# Patient Record
Sex: Female | Born: 1965 | Race: White | Hispanic: No | Smoking: Former smoker
Health system: Southern US, Community
[De-identification: ages and names within clinical notes are randomized; demographics above are authoritative.]

## PROBLEM LIST (undated history)

## (undated) DIAGNOSIS — G43909 Migraine, unspecified, not intractable, without status migrainosus: Secondary | ICD-10-CM

## (undated) DIAGNOSIS — I10 Essential (primary) hypertension: Secondary | ICD-10-CM

---

## 2003-07-25 ENCOUNTER — Other Ambulatory Visit: Payer: Self-pay

## 2004-02-17 ENCOUNTER — Emergency Department: Payer: Self-pay | Admitting: Emergency Medicine

## 2004-07-27 ENCOUNTER — Emergency Department: Payer: Self-pay | Admitting: Emergency Medicine

## 2004-12-28 ENCOUNTER — Emergency Department: Payer: Self-pay | Admitting: Emergency Medicine

## 2005-03-13 ENCOUNTER — Emergency Department: Payer: Self-pay | Admitting: Unknown Physician Specialty

## 2005-11-09 ENCOUNTER — Emergency Department: Payer: Self-pay | Admitting: Emergency Medicine

## 2007-07-17 ENCOUNTER — Emergency Department: Payer: Self-pay | Admitting: Internal Medicine

## 2007-09-03 ENCOUNTER — Emergency Department: Payer: Self-pay | Admitting: Emergency Medicine

## 2009-01-11 ENCOUNTER — Emergency Department: Payer: Self-pay | Admitting: Emergency Medicine

## 2009-04-27 ENCOUNTER — Emergency Department: Payer: Self-pay | Admitting: Emergency Medicine

## 2010-04-05 ENCOUNTER — Emergency Department: Payer: Self-pay | Admitting: Emergency Medicine

## 2010-11-09 IMAGING — CR DG CHEST 2V
1 series · 2 of 2 positions shown · non-contrast
Comparison: none

REASON FOR EXAM: pain
COMMENTS:

[Series 1: view not recorded · 0.17mm/px · 2 of 2 slices shown]
[im 1/2]
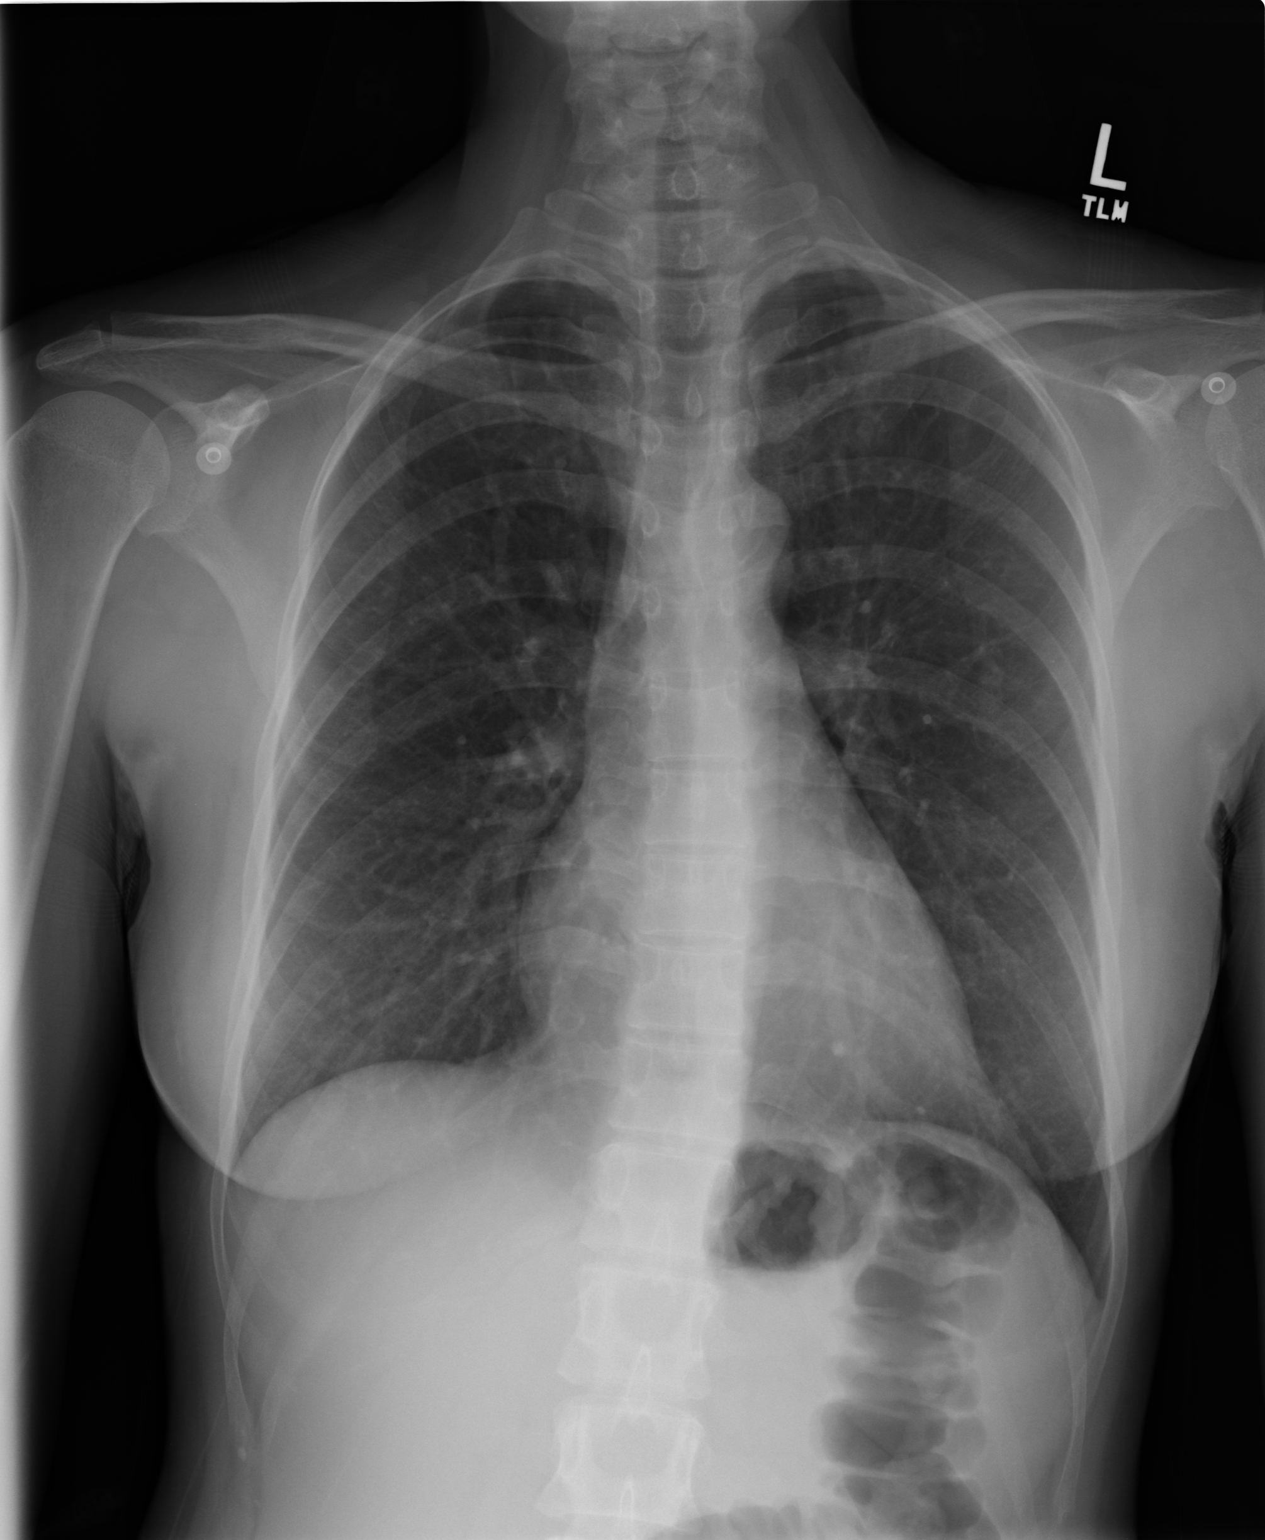
[im 2/2]
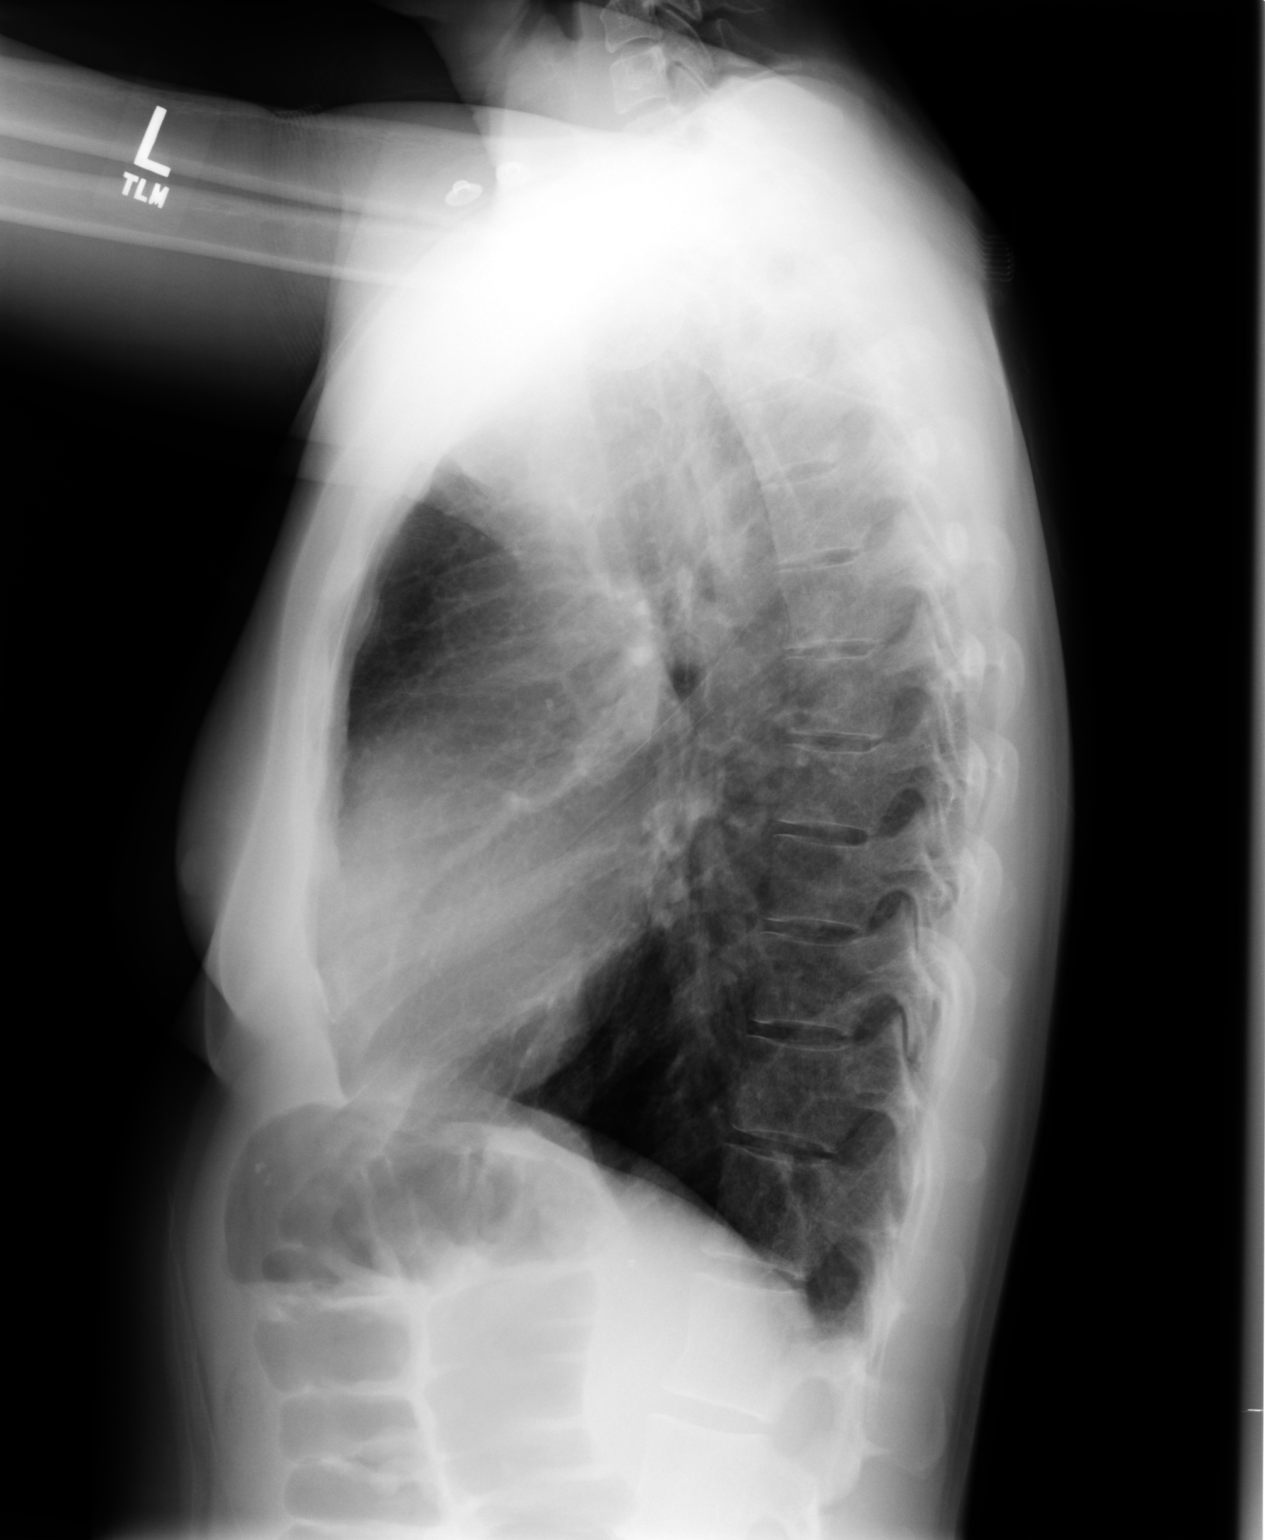

[2 of 2 positions shown; findings below may reference images not displayed]

PROCEDURE:     DXR - DXR CHEST PA (OR AP) AND LATERAL  - January 11, 2009  [DATE]

RESULT:     There is no previous exam for comparison.

There appear to be nodular densities in the hilar regions more numerous on
the left and at the lung bases consistent with calcified granulomas. The
lungs are otherwise clear. The heart and pulmonary vessels are normal. The
bony and mediastinal structures unremarkable.
IMPRESSION: Granulomatous changes. No acute cardiopulmonary disease
evident.

## 2011-10-26 ENCOUNTER — Emergency Department: Payer: Self-pay | Admitting: Emergency Medicine

## 2011-10-28 LAB — BETA STREP CULTURE(ARMC)

## 2012-02-23 ENCOUNTER — Emergency Department: Payer: Self-pay | Admitting: Emergency Medicine

## 2012-02-23 LAB — RAPID INFLUENZA A&B ANTIGENS

## 2012-03-14 ENCOUNTER — Emergency Department: Payer: Self-pay | Admitting: Emergency Medicine

## 2012-03-17 LAB — BETA STREP CULTURE(ARMC)

## 2012-12-12 ENCOUNTER — Emergency Department: Payer: Self-pay | Admitting: Emergency Medicine

## 2013-09-19 ENCOUNTER — Emergency Department: Payer: Self-pay | Admitting: Emergency Medicine

## 2013-10-18 ENCOUNTER — Emergency Department: Payer: Self-pay | Admitting: Emergency Medicine

## 2013-10-22 ENCOUNTER — Emergency Department: Payer: Self-pay | Admitting: Emergency Medicine

## 2013-10-22 LAB — COMPREHENSIVE METABOLIC PANEL
ALT: 17 U/L
Albumin: 4.6 g/dL (ref 3.4–5.0)
Alkaline Phosphatase: 98 U/L
Anion Gap: 12 (ref 7–16)
BUN: 26 mg/dL — ABNORMAL HIGH (ref 7–18)
Bilirubin,Total: 0.7 mg/dL (ref 0.2–1.0)
CALCIUM: 9.5 mg/dL (ref 8.5–10.1)
CHLORIDE: 94 mmol/L — AB (ref 98–107)
CO2: 25 mmol/L (ref 21–32)
Creatinine: 1.62 mg/dL — ABNORMAL HIGH (ref 0.60–1.30)
GFR CALC AF AMER: 42 — AB
GFR CALC NON AF AMER: 37 — AB
GLUCOSE: 78 mg/dL (ref 65–99)
Osmolality: 266 (ref 275–301)
Potassium: 4.1 mmol/L (ref 3.5–5.1)
SGOT(AST): 25 U/L (ref 15–37)
Sodium: 131 mmol/L — ABNORMAL LOW (ref 136–145)
TOTAL PROTEIN: 9 g/dL — AB (ref 6.4–8.2)

## 2013-10-22 LAB — CBC
HCT: 48.5 % — ABNORMAL HIGH (ref 35.0–47.0)
HGB: 16 g/dL (ref 12.0–16.0)
MCH: 28.2 pg (ref 26.0–34.0)
MCHC: 33.1 g/dL (ref 32.0–36.0)
MCV: 85 fL (ref 80–100)
PLATELETS: 290 10*3/uL (ref 150–440)
RBC: 5.69 10*6/uL — ABNORMAL HIGH (ref 3.80–5.20)
RDW: 13.6 % (ref 11.5–14.5)
WBC: 7.8 10*3/uL (ref 3.6–11.0)

## 2013-10-22 LAB — URINALYSIS, COMPLETE
GLUCOSE, UR: NEGATIVE mg/dL (ref 0–75)
LEUKOCYTE ESTERASE: NEGATIVE
NITRITE: NEGATIVE
Ph: 5 (ref 4.5–8.0)
Protein: NEGATIVE
RBC,UR: 1 /HPF (ref 0–5)
Specific Gravity: 1.006 (ref 1.003–1.030)
WBC UR: 1 /HPF (ref 0–5)

## 2013-10-22 LAB — LIPASE, BLOOD: LIPASE: 147 U/L (ref 73–393)

## 2014-05-03 ENCOUNTER — Emergency Department: Payer: Self-pay | Admitting: Internal Medicine

## 2014-05-28 ENCOUNTER — Emergency Department: Payer: Self-pay | Admitting: Student

## 2014-06-29 ENCOUNTER — Other Ambulatory Visit: Payer: Self-pay | Admitting: *Deleted

## 2014-06-29 DIAGNOSIS — Z139 Encounter for screening, unspecified: Secondary | ICD-10-CM

## 2014-07-17 ENCOUNTER — Emergency Department
Admission: EM | Admit: 2014-07-17 | Discharge: 2014-07-17 | Disposition: A | Discharge status: Home / Self Care | Payer: Self-pay | Attending: Emergency Medicine | Admitting: Emergency Medicine

## 2014-07-17 ENCOUNTER — Encounter: Payer: Self-pay | Admitting: Emergency Medicine

## 2014-07-17 DIAGNOSIS — R1030 Lower abdominal pain, unspecified: Secondary | ICD-10-CM

## 2014-07-17 DIAGNOSIS — Z880 Allergy status to penicillin: Secondary | ICD-10-CM | POA: Insufficient documentation

## 2014-07-17 DIAGNOSIS — R197 Diarrhea, unspecified: Secondary | ICD-10-CM

## 2014-07-17 DIAGNOSIS — K529 Noninfective gastroenteritis and colitis, unspecified: Secondary | ICD-10-CM | POA: Insufficient documentation

## 2014-07-17 HISTORY — DX: Essential (primary) hypertension: I10

## 2014-07-17 LAB — COMPREHENSIVE METABOLIC PANEL
ALT: 22 U/L (ref 14–54)
AST: 23 U/L (ref 15–41)
Albumin: 4.5 g/dL (ref 3.5–5.0)
Alkaline Phosphatase: 90 U/L (ref 38–126)
Anion gap: 13 (ref 5–15)
BILIRUBIN TOTAL: 0.7 mg/dL (ref 0.3–1.2)
BUN: 29 mg/dL — ABNORMAL HIGH (ref 6–20)
CO2: 31 mmol/L (ref 22–32)
CREATININE: 1.48 mg/dL — AB (ref 0.44–1.00)
Calcium: 9.4 mg/dL (ref 8.9–10.3)
Chloride: 94 mmol/L — ABNORMAL LOW (ref 101–111)
GFR, EST AFRICAN AMERICAN: 46 mL/min — AB (ref >60)
GFR, EST NON AFRICAN AMERICAN: 40 mL/min — AB (ref >60)
Glucose, Bld: 123 mg/dL — ABNORMAL HIGH (ref 65–99)
POTASSIUM: 2.6 mmol/L — AB (ref 3.5–5.1)
Sodium: 138 mmol/L (ref 135–145)
Total Protein: 8.4 g/dL — ABNORMAL HIGH (ref 6.5–8.1)

## 2014-07-17 LAB — URINALYSIS COMPLETE WITH MICROSCOPIC (ARMC ONLY)
BILIRUBIN URINE: NEGATIVE
Bacteria, UA: NONE SEEN
Glucose, UA: NEGATIVE mg/dL
Ketones, ur: NEGATIVE mg/dL
Nitrite: NEGATIVE
Protein, ur: 100 mg/dL — AB
Specific Gravity, Urine: 1.023 (ref 1.005–1.030)
pH: 5 (ref 5.0–8.0)

## 2014-07-17 LAB — CBC WITH DIFFERENTIAL/PLATELET
Basophils Absolute: 0 10*3/uL (ref 0–0.1)
Basophils Relative: 0 %
Eosinophils Absolute: 0.2 10*3/uL (ref 0–0.7)
Eosinophils Relative: 1 %
HCT: 41.5 % (ref 35.0–47.0)
Hemoglobin: 14.1 g/dL (ref 12.0–16.0)
LYMPHS ABS: 1 10*3/uL (ref 1.0–3.6)
MCH: 28.4 pg (ref 26.0–34.0)
MCHC: 33.9 g/dL (ref 32.0–36.0)
MCV: 83.8 fL (ref 80.0–100.0)
Monocytes Absolute: 1 10*3/uL — ABNORMAL HIGH (ref 0.2–0.9)
Monocytes Relative: 8 %
Neutro Abs: 9.8 10*3/uL — ABNORMAL HIGH (ref 1.4–6.5)
PLATELETS: 311 10*3/uL (ref 150–440)
RBC: 4.96 MIL/uL (ref 3.80–5.20)
RDW: 13.6 % (ref 11.5–14.5)
WBC: 11.9 10*3/uL — ABNORMAL HIGH (ref 3.6–11.0)

## 2014-07-17 MED ORDER — ONDANSETRON HCL 4 MG PO TABS
ORAL_TABLET | ORAL | Status: AC
  Filled 2014-07-17: qty 1

## 2014-07-17 MED ORDER — POTASSIUM CHLORIDE CRYS ER 20 MEQ PO TBCR
EXTENDED_RELEASE_TABLET | ORAL | Status: AC
  Filled 2014-07-17: qty 2

## 2014-07-17 MED ORDER — POTASSIUM CHLORIDE CRYS ER 20 MEQ PO TBCR
40.0000 meq | EXTENDED_RELEASE_TABLET | Freq: Once | ORAL | Status: AC
  Administered 2014-07-17: 40 meq via ORAL

## 2014-07-17 MED ORDER — METRONIDAZOLE 500 MG PO TABS
ORAL_TABLET | ORAL | Status: AC
  Filled 2014-07-17: qty 1

## 2014-07-17 MED ORDER — ONDANSETRON HCL 4 MG PO TABS: 4.0000 mg | ORAL_TABLET | Freq: Every day | ORAL | Status: AC | PRN

## 2014-07-17 MED ORDER — POTASSIUM CHLORIDE 20 MEQ PO PACK: 20.0000 meq | PACK | Freq: Two times a day (BID) | ORAL | Status: DC

## 2014-07-17 MED ORDER — ONDANSETRON HCL 4 MG PO TABS
4.0000 mg | ORAL_TABLET | Freq: Once | ORAL | Status: AC
  Administered 2014-07-17: 4 mg via ORAL

## 2014-07-17 MED ORDER — METRONIDAZOLE 500 MG PO TABS: 500.0000 mg | ORAL_TABLET | Freq: Two times a day (BID) | ORAL | Status: AC

## 2014-07-17 MED ORDER — METRONIDAZOLE 500 MG PO TABS
500.0000 mg | ORAL_TABLET | Freq: Once | ORAL | Status: AC
  Administered 2014-07-17: 500 mg via ORAL

## 2014-07-17 NOTE — ED Notes (Signed)
Pt informed to return if life threatening symptoms occur.   

## 2014-07-17 NOTE — ED Notes (Signed)
Pt reports that she developed BLQ pain a few days ago, today she started having Vomiting

## 2014-07-17 NOTE — Discharge Instructions (Signed)
With your crampy abdominal pain and diarrhea, we agreed that taking an antibiotic and replacing her potassium level is appropriate. Follow-up with your doctor in [place]. Take potassium twice a day as prescribed. He may use Zofran if needed for nausea. Return to the emergency department if you have worsening pain ,if you have fever, or if she had other urgent concerns.   Diarrhea Diarrhea is watery poop (stool). It can make you feel weak, tired, thirsty, or give you a dry mouth (signs of dehydration). Watery poop is a sign of another problem, most often an infection. It often lasts 2-3 days. It can last longer if it is a sign of something serious. Take care of yourself as told by your doctor. HOME CARE   Drink 1 cup (8 ounces) of fluid each time you have watery poop.  Do not drink the following fluids:  Those that contain simple sugars (fructose, glucose, galactose, lactose, sucrose, maltose).  Sports drinks.  Fruit juices.  Whole milk products.  Sodas.  Drinks with caffeine (coffee, tea, soda) or alcohol.  Oral rehydration solution may be used if the doctor says it is okay. You may make your own solution. Follow this recipe:   - teaspoon table salt.   teaspoon baking soda.   teaspoon salt substitute containing potassium chloride.  1 tablespoons sugar.  1 liter (34 ounces) of water.  Avoid the following foods:  High fiber foods, such as raw fruits and vegetables.  Nuts, seeds, and whole grain breads and cereals.   Those that are sweetened with sugar alcohols (xylitol, sorbitol, mannitol).  Try eating the following foods:  Starchy foods, such as rice, toast, pasta, low-sugar cereal, oatmeal, baked potatoes, crackers, and bagels.  Bananas.  Applesauce.  Eat probiotic-rich foods, such as yogurt and milk products that are fermented.  Wash your hands well after each time you have watery poop.  Only take medicine as told by your doctor.  Take a warm bath to help  lessen burning or pain from having watery poop. GET HELP RIGHT AWAY IF:   You cannot drink fluids without throwing up (vomiting).  You keep throwing up.  You have blood in your poop, or your poop looks black and tarry.  You do not pee (urinate) in 6-8 hours, or there is only a small amount of very dark pee.  You have belly (abdominal) pain that gets worse or stays in the same spot (localizes).  You are weak, dizzy, confused, or light-headed.  You have a very bad headache.  Your watery poop gets worse or does not get better.  You have a fever or lasting symptoms for more than 2-3 days.  You have a fever and your symptoms suddenly get worse. MAKE SURE YOU:   Understand these instructions.  Will watch your condition.  Will get help right away if you are not doing well or get worse. Document Released: 08/19/2007 Document Revised: 07/17/2013 Document Reviewed: 11/08/2011 [facility] Patient Information 2015 [place], [place]. This information is not intended to replace advice given to you by your health care provider. Make sure you discuss any questions you have with your health care provider.

## 2014-07-17 NOTE — ED Provider Notes (Signed)
[facility] Emergency Department Provider Note    ____________________________________________  Time seen: 12:45 PM  I have reviewed the triage vital signs and the nursing notes.   HISTORY  Chief Complaint Abdominal Pain   HPI Alexandria Waller is a 49 y.o. female who presents with intermittent abdominal pain and cramping over the past week and a half. She has had intermittent diarrhea as well as occasional vomiting. Her discomfort is in the lower abdomen, primarily central. She has called out from work for a few days due to these symptoms. Today she was at work and had to go to the bathroom 5 times and shows to seek care. She also vomited once this morning. Her pain is primarily crampy and moderate. She looks comfortable during the interview. She has no surgical history. She did recently see her primary care physician in [place] for a routine visit. She had her tetanus updated and has plans for [facility] tomorrow.  Past Medical History  Diagnosis Date  . Hypertension    hypokalemia  There are no active problems to display for this patient.   History reviewed. No pertinent past surgical history.  Current Outpatient Rx  Name  Route  Sig  Dispense  Refill  . metroNIDAZOLE (FLAGYL) 500 MG tablet   Oral   Take 1 tablet (500 mg total) by mouth 2 (two) times daily.   15 tablet   0   . ondansetron (ZOFRAN) 4 MG tablet   Oral   Take 1 tablet (4 mg total) by mouth daily as needed for nausea or vomiting.   10 tablet   1   . potassium chloride (KLOR-CON) 20 MEQ packet   Oral   Take 20 mEq by mouth 2 (two) times daily.   8 tablet   0     Allergies Amoxicillin; Aspirin; and Penicillins  History reviewed. No pertinent family history.  Social History History  Substance Use Topics  . Smoking status: Never Smoker   . Smokeless tobacco: Not on file  . Alcohol Use: No    Review  of Systems  Constitutional: Negative for fever. Eyes: Negative for visual changes. ENT: Patient reports a history of sinus problems. Cardiovascular: Negative for chest pain. Respiratory: Negative for shortness of breath. Gastrointestinal: Patient presents with abdominal pain, vomiting and diarrhea. See history of present illness for details. Genitourinary: Negative for dysuria. Musculoskeletal: Negative for back pain. Skin: Negative for rash. Neurological: Negative for headaches, focal weakness or numbness.    10-point ROS otherwise negative.  ____________________________________________   PHYSICAL EXAM:  VITAL SIGNS: ED Triage Vitals  Enc Vitals Group     BP --      Pulse Rate 07/17/14 1112 99     Resp 07/17/14 1112 20     Temp 07/17/14 1112 97.9 F (36.6 C)     Temp Source 07/17/14 1112 Oral     SpO2 07/17/14 1112 99 %     Weight 07/17/14 1112 125 lb (56.7 kg)     Height 07/17/14 1112 5\' 3"  (1.6 m)     Head Cir --      Peak Flow --      Pain Score 07/17/14 1115 8     Pain Loc --      Pain Edu? --      Excl. in GC? --      Constitutional: Alert and oriented. Well appearing and in no distress. ENT  Head: Normocephalic and atraumatic.   Nose: No congestion/rhinnorhea.   Mouth/Throat: Mucous membranes are moist.  Hematological/Lymphatic/Immunilogical: No cervical lymphadenopathy. Cardiovascular: Normal rate, regular rhythm. Normal and symmetric distal pulses are present in all extremities. No murmurs, rubs, or gallops. Respiratory: Normal respiratory effort without tachypnea nor retractions. Breath sounds are clear and equal bilaterally. No wheezes/rales/rhonchi. Gastrointestinal: Soft abdomen which is mostly benign. She has no peritoneal signs. She does have a mild amount of tenderness in the lower central abdomen. There is no tenderness over McBurney's point.  Musculoskeletal: Nontender with normal range of motion in all extremities. No joint  effusions.  No lower extremity tenderness nor edema. Neurologic:  Normal speech and language. No gross focal neurologic deficits are appreciated. Speech is normal. No gait instability. Skin:  Skin is warm, dry and intact. No rash noted. Psychiatric: The patient is pleasant, calm, and personable. Mood and affect are normal. Speech and behavior are normal. Patient exhibits appropriate insight and judgment.  ____________________________________________    LABS (pertinent positives/negatives)  Notable lab findings include a potassium of 2.6. She has a BUN and of 29 creatinine of 1.48. Her urinalysis shows 6-30 white blood cells with only a trace of leukocytes.  ____________________________________________   INITIAL IMPRESSION / ASSESSMENT AND PLAN / ED COURSE  Pertinent labs & imaging results that were available during my care of the patient were reviewed by me and considered in my medical decision making (see chart for details).  The patient appears comfortable in no acute distress. I have discussed a CT scan of the abdomen versus the alternative of treatment with antibiotics and antinausea medicine. The patient agrees that a CT scan is not indicated at this time area given the patient's history of "sinus infections" and the use of antibiotics, she is at some risk for Clostridium difficile. We have attempted to obtain a sample of stool to test for C. Difficile, but she is not sure if she'll be able to have a bowel movement at this time.. We will treat with metronidazole and with Zofran.  We will begin her on oral potassium replacement and prescribed potassium over the next 4 days to replenish her potassium level. The patient is able to follow up with her primary physician in [place], but she also has an appointment tomorrow for a Pap smear as well as a mammogram. The patient agrees with this plan and will return to the emergency department if her pain worsens, if she has fever, or if she has any  other urgent concerns.  ____________________________________________   FINAL CLINICAL IMPRESSION(S) / ED DIAGNOSES  Final diagnoses:  Colitis  Lower abdominal pain  Diarrhea   hypokalemia   Darien Ramus, MD 07/17/14 1339

## 2014-07-18 ENCOUNTER — Ambulatory Visit: Payer: Self-pay

## 2014-07-25 ENCOUNTER — Other Ambulatory Visit: Payer: Self-pay

## 2014-07-25 ENCOUNTER — Encounter: Payer: Self-pay | Admitting: Emergency Medicine

## 2014-07-25 ENCOUNTER — Emergency Department
Admission: EM | Admit: 2014-07-25 | Discharge: 2014-07-25 | Disposition: A | Discharge status: Home / Self Care | Payer: Self-pay | Attending: Emergency Medicine | Admitting: Emergency Medicine

## 2014-07-25 DIAGNOSIS — Z79899 Other long term (current) drug therapy: Secondary | ICD-10-CM | POA: Insufficient documentation

## 2014-07-25 DIAGNOSIS — Z880 Allergy status to penicillin: Secondary | ICD-10-CM | POA: Insufficient documentation

## 2014-07-25 DIAGNOSIS — K529 Noninfective gastroenteritis and colitis, unspecified: Secondary | ICD-10-CM | POA: Insufficient documentation

## 2014-07-25 DIAGNOSIS — R42 Dizziness and giddiness: Secondary | ICD-10-CM | POA: Insufficient documentation

## 2014-07-25 DIAGNOSIS — I10 Essential (primary) hypertension: Secondary | ICD-10-CM | POA: Insufficient documentation

## 2014-07-25 DIAGNOSIS — Z3202 Encounter for pregnancy test, result negative: Secondary | ICD-10-CM | POA: Insufficient documentation

## 2014-07-25 LAB — COMPREHENSIVE METABOLIC PANEL
ALK PHOS: 65 U/L (ref 38–126)
ALT: 14 U/L (ref 14–54)
AST: 21 U/L (ref 15–41)
Albumin: 4.2 g/dL (ref 3.5–5.0)
Anion gap: 9 (ref 5–15)
BUN: 27 mg/dL — ABNORMAL HIGH (ref 6–20)
CALCIUM: 9.3 mg/dL (ref 8.9–10.3)
CHLORIDE: 101 mmol/L (ref 101–111)
CO2: 27 mmol/L (ref 22–32)
Creatinine, Ser: 1.09 mg/dL — ABNORMAL HIGH (ref 0.44–1.00)
GFR calc Af Amer: >60 mL/min (ref >60)
GFR calc non Af Amer: 58 mL/min — ABNORMAL LOW (ref >60)
Glucose, Bld: 84 mg/dL (ref 65–99)
POTASSIUM: 3.7 mmol/L (ref 3.5–5.1)
Sodium: 137 mmol/L (ref 135–145)
Total Bilirubin: 0.5 mg/dL (ref 0.3–1.2)
Total Protein: 7 g/dL (ref 6.5–8.1)

## 2014-07-25 LAB — CBC WITH DIFFERENTIAL/PLATELET
BASOS PCT: 0 %
Basophils Absolute: 0 10*3/uL (ref 0–0.1)
Eosinophils Absolute: 0.1 10*3/uL (ref 0–0.7)
Eosinophils Relative: 0 %
HCT: 39.1 % (ref 35.0–47.0)
Hemoglobin: 12.7 g/dL (ref 12.0–16.0)
LYMPHS PCT: 4 %
Lymphs Abs: 0.8 10*3/uL — ABNORMAL LOW (ref 1.0–3.6)
MCH: 27.9 pg (ref 26.0–34.0)
MCHC: 32.6 g/dL (ref 32.0–36.0)
MCV: 85.7 fL (ref 80.0–100.0)
MONOS PCT: 5 %
Monocytes Absolute: 1 10*3/uL — ABNORMAL HIGH (ref 0.2–0.9)
NEUTROS PCT: 91 %
Neutro Abs: 17.8 10*3/uL — ABNORMAL HIGH (ref 1.4–6.5)
Platelets: 264 10*3/uL (ref 150–440)
RBC: 4.56 MIL/uL (ref 3.80–5.20)
RDW: 14.4 % (ref 11.5–14.5)
WBC: 19.8 10*3/uL — AB (ref 3.6–11.0)

## 2014-07-25 LAB — URINALYSIS COMPLETE WITH MICROSCOPIC (ARMC ONLY)
Bacteria, UA: NONE SEEN
Bilirubin Urine: NEGATIVE
GLUCOSE, UA: NEGATIVE mg/dL
Hgb urine dipstick: NEGATIVE
Ketones, ur: NEGATIVE mg/dL
Leukocytes, UA: NEGATIVE
Nitrite: NEGATIVE
Protein, ur: NEGATIVE mg/dL
SPECIFIC GRAVITY, URINE: 1.015 (ref 1.005–1.030)
pH: 6 (ref 5.0–8.0)

## 2014-07-25 LAB — TROPONIN I: Troponin I: <0.03 ng/mL (ref <0.031)

## 2014-07-25 LAB — PREGNANCY, URINE: PREG TEST UR: NEGATIVE

## 2014-07-25 LAB — LIPASE, BLOOD: Lipase: 48 U/L (ref 22–51)

## 2014-07-25 MED ORDER — METRONIDAZOLE 500 MG PO TABS: 500.0000 mg | ORAL_TABLET | Freq: Three times a day (TID) | ORAL | Status: AC

## 2014-07-25 MED ORDER — ONDANSETRON 4 MG PO TBDP
ORAL_TABLET | ORAL | Status: AC
  Filled 2014-07-25: qty 1

## 2014-07-25 MED ORDER — ONDANSETRON HCL 4 MG PO TABS: 4.0000 mg | ORAL_TABLET | Freq: Every day | ORAL | Status: AC | PRN

## 2014-07-25 MED ORDER — ONDANSETRON 4 MG PO TBDP
4.0000 mg | ORAL_TABLET | Freq: Once | ORAL | Status: AC
  Administered 2014-07-25: 4 mg via ORAL

## 2014-07-25 NOTE — Discharge Instructions (Signed)

## 2014-07-25 NOTE — ED Notes (Signed)
Patient asked to put gown on and patient refused. She advised she only wanted to know her urine and blood results.

## 2014-07-25 NOTE — ED Notes (Signed)
Called lab about results.  Lab looking into them

## 2014-07-25 NOTE — ED Provider Notes (Signed)
[facility] Emergency Department Provider Note    Time seen: 1430  I have reviewed the triage vital signs and the nursing notes.   HISTORY  Chief Complaint Vomiting and Dizziness    HPI Alexandria Waller is a 49 y.o. female who presents to ER for nausea dizziness and diarrhea today. Patient was recently seen in the ER and diagnosed with colitis. Nausea and diarrhea has subsided for now it is been mild today. Diarrhea has not been completely watery, and it had resolved since she has finished antibiotics when she was here last time. Describes some intermittent cramping abdominal pain. Was sent here for work for same    Past Medical History  Diagnosis Date  . Hypertension     There are no active problems to display for this patient.   History reviewed. No pertinent past surgical history.  Current Outpatient Rx  Name  Route  Sig  Dispense  Refill  . carvedilol (COREG) 25 MG tablet   Oral   Take 25 mg by mouth 2 (two) times daily with a meal.         . cetirizine (ZYRTEC) 10 MG tablet   Oral   Take 10 mg by mouth daily.         . fluticasone (FLONASE) 50 MCG/ACT nasal spray   Each Nare   Place 2 sprays into both nostrils daily.         . hydrochlorothiazide (HYDRODIURIL) 25 MG tablet   Oral   Take 25 mg by mouth daily.         Marland Kitchen. ibuprofen (ADVIL,MOTRIN) 200 MG tablet   Oral   Take 200 mg by mouth every 6 (six) hours as needed for headache.         . ondansetron (ZOFRAN) 4 MG tablet   Oral   Take 1 tablet (4 mg total) by mouth daily as needed for nausea or vomiting.   10 tablet   1   . potassium chloride (KLOR-CON) 20 MEQ packet   Oral   Take 20 mEq by mouth 2 (two) times daily.   8 tablet   0   . PRESCRIPTION MEDICATION      Migraine PRN medication- unable to verify name of medication           Allergies Amoxicillin; Aspirin; and Penicillins  No family history on file.  Social History History  Substance Use  Topics  . Smoking status: Never Smoker   . Smokeless tobacco: Not on file  . Alcohol Use: No    Review of Systems Constitutional: Negative for fever. Eyes: Negative for visual changes. ENT: Negative for sore throat. Cardiovascular: Negative for chest pain. Respiratory: Negative for shortness of breath. Gastrointestinal: Mild abdominal pain, nausea and diarrhea. Genitourinary: Negative for dysuria. Musculoskeletal: Negative for back pain. Skin: Negative for rash. Neurological: Negative for headaches, positive for dizziness  10-point ROS otherwise negative.  ____________________________________________   PHYSICAL EXAM:  VITAL SIGNS: ED Triage Vitals  Enc Vitals Group     BP 07/25/14 1129 124/79 mmHg     Pulse Rate 07/25/14 1129 88     Resp 07/25/14 1129 14     Temp 07/25/14 1129 97.6 F (36.4 C)     Temp Source 07/25/14 1129 Oral     SpO2 07/25/14 1129 97 %     Weight 07/25/14 1129 125 lb (56.7 kg)     Height 07/25/14 1129 5\' 2"  (1.575 m)     Head Cir --  Peak Flow --      Pain Score 07/25/14 1130 5     Pain Loc --      Pain Edu? --      Excl. in GC? --     Constitutional: Alert and oriented. Well appearing and in no distress. Eyes: Conjunctivae are normal. PERRL. Normal extraocular movements. ENT   Head: Normocephalic and atraumatic.   Nose: No congestion/rhinnorhea.   Mouth/Throat: Mucous membranes are moist.   Neck: No stridor. Hematological/Lymphatic/Immunilogical: No cervical lymphadenopathy. Cardiovascular: Normal rate, regular rhythm. Normal and symmetric distal pulses are present in all extremities. No murmurs, rubs, or gallops. Respiratory: Normal respiratory effort without tachypnea nor retractions. Breath sounds are clear and equal bilaterally. No wheezes/rales/rhonchi. Gastrointestinal: Soft and nontender. No distention. No abdominal bruits. There is no CVA tenderness. Musculoskeletal: Nontender with normal range of motion in all  extremities. No joint effusions.  No lower extremity tenderness nor edema. Neurologic:  Normal speech and language. No gross focal neurologic deficits are appreciated. Speech is normal. No gait instability. Skin:  Skin is warm, dry and intact. No rash noted. Psychiatric: Mood and affect are normal. Speech and behavior are normal. Patient exhibits appropriate insight and judgment.  ____________________________________________    LABS (pertinent positives/negatives)  Labs Reviewed  CBC WITH DIFFERENTIAL/PLATELET - Abnormal; Notable for the following:    WBC 19.8 (*)    Neutro Abs 17.8 (*)    Lymphs Abs 0.8 (*)    Monocytes Absolute 1.0 (*)    All other components within normal limits  URINALYSIS COMPLETEWITH MICROSCOPIC ([facility])  - Abnormal; Notable for the following:    Color, Urine YELLOW (*)    APPearance CLEAR (*)    Squamous Epithelial / LPF 0-5 (*)    All other components within normal limits  PREGNANCY, URINE  LIPASE, BLOOD  TROPONIN I  COMPREHENSIVE METABOLIC PANEL     ____________________________________________    RADIOLOGY  None  ____________________________________________    ED COURSE  Pertinent labs & imaging results that were available during my care of the patient were reviewed by me and considered in my medical decision making (see chart for details).  Check basic labs, by mouth Zofran and reassess.  FINAL ASSESSMENT AND PLAN  Diarrhea and leukocytosis  Plan: Patient is a very benign abdominal exam. Despite the fact that her white count has gone up, she looks very well. 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 During this time she'll need follow-up with GI for reevaluation. Patient is also unable to provide an additional stool sample here to send for culture.    Emily FilbertWilliams, Jonathan E, MD   Emily FilbertJonathan E Williams, MD 07/25/14 [phone] [id number]

## 2014-07-25 NOTE — ED Notes (Signed)
Patient c/o vomiting, chills and dizziness. Symptoms started today. States she had similar symptoms last week and was diagnosed with colitis.

## 2014-08-29 ENCOUNTER — Encounter: Payer: Self-pay | Admitting: *Deleted

## 2014-08-29 ENCOUNTER — Ambulatory Visit: Payer: Self-pay | Attending: Oncology | Admitting: *Deleted

## 2014-08-29 ENCOUNTER — Ambulatory Visit
Admission: RE | Admit: 2014-08-29 | Discharge: 2014-08-29 | Disposition: A | Discharge status: Home / Self Care | Payer: Self-pay | Source: Ambulatory Visit | Attending: Oncology | Admitting: Oncology

## 2014-08-29 VITALS — BP 160/113 | HR 71 | Temp 98.0°F | Resp 20 | Ht 63.39 in | Wt 122.9 lb

## 2014-08-29 DIAGNOSIS — Z0000 Encounter for general adult medical examination without abnormal findings: Secondary | ICD-10-CM

## 2014-08-29 NOTE — Progress Notes (Signed)
Subjective:     Patient ID: Alexandria Waller, female   DOB: 1965/03/24, 49 y.o.   MRN: [id number]  HPI   Review of Systems     Objective:   Physical Exam  Pulmonary/Chest: Right breast exhibits no inverted nipple, no mass, no nipple discharge, no skin change and no tenderness. Left breast exhibits no inverted nipple, no mass, no nipple discharge, no skin change and no tenderness.  Abdominal: She exhibits no mass. There is no splenomegaly or hepatomegaly.  Genitourinary: Rectal exam shows no mass and no tenderness. Uterus is not deviated, not enlarged, not fixed and not tender. Cervix exhibits no motion tenderness and no friability. Right adnexum displays no mass, no tenderness and no fullness. Left adnexum displays no mass, no tenderness and no fullness.         Assessment:     49 year old White female presents to [facility] for clinical breast exam, pap smear and mammogram.  Clinical breast exam unremarkable.  Specimen collected for pap smear without difficulty.  Blood pressure elevated at 160/113.  Patient states her blood pressure is like this when she goes to the doctor but will come down later.  She is to go to the ED or  recheck her blood pressure at [facility] or [facility], and if remains higher than 140/90 she is to follow-up with her primary care provider.  Hand out on hypertention given to patient.  Patient has been screened for eligibility.  She does not have any insurance, Medicare or Medicaid.  She also meets financial eligibility.  Hand-out given on the Affordable Care Act.     Plan:     Screening mammogram ordered.  Patient states she will recheck her blood pressure and if remains elevated she will see her MD today.

## 2014-08-31 LAB — PAP LB AND HPV HIGH-RISK
HPV, HIGH-RISK: NEGATIVE
PAP SMEAR COMMENT: 0

## 2014-09-10 ENCOUNTER — Other Ambulatory Visit: Payer: Self-pay | Admitting: *Deleted

## 2014-09-10 DIAGNOSIS — N63 Unspecified lump in unspecified breast: Secondary | ICD-10-CM

## 2014-09-10 DIAGNOSIS — R928 Other abnormal and inconclusive findings on diagnostic imaging of breast: Secondary | ICD-10-CM

## 2014-09-19 ENCOUNTER — Ambulatory Visit
Admission: RE | Admit: 2014-09-19 | Discharge: 2014-09-19 | Disposition: A | Discharge status: Home / Self Care | Payer: Self-pay | Source: Ambulatory Visit | Attending: Oncology | Admitting: Oncology

## 2014-09-19 DIAGNOSIS — N63 Unspecified lump in unspecified breast: Secondary | ICD-10-CM

## 2014-09-19 DIAGNOSIS — R928 Other abnormal and inconclusive findings on diagnostic imaging of breast: Secondary | ICD-10-CM

## 2014-09-20 ENCOUNTER — Other Ambulatory Visit: Payer: Self-pay | Admitting: *Deleted

## 2014-09-20 DIAGNOSIS — N63 Unspecified lump in unspecified breast: Secondary | ICD-10-CM

## 2014-09-20 NOTE — Progress Notes (Signed)
Birads 4 mammogram result.  Order placed for ultrasound guided left breast biopsy for left breast mass per protocol.

## 2014-09-21 ENCOUNTER — Other Ambulatory Visit: Payer: Self-pay | Admitting: *Deleted

## 2014-09-21 ENCOUNTER — Ambulatory Visit
Admission: RE | Admit: 2014-09-21 | Discharge: 2014-09-21 | Disposition: A | Discharge status: Home / Self Care | Payer: Self-pay | Source: Ambulatory Visit | Attending: Oncology | Admitting: Oncology

## 2014-09-21 DIAGNOSIS — N63 Unspecified lump in unspecified breast: Secondary | ICD-10-CM

## 2014-09-24 LAB — SURGICAL PATHOLOGY

## 2014-09-27 ENCOUNTER — Telehealth: Payer: Self-pay | Admitting: *Deleted

## 2014-09-27 NOTE — Telephone Encounter (Signed)
Tried to call patient with her benign breast biopsy results.  Left message for her to return my call.

## 2014-10-10 ENCOUNTER — Encounter: Payer: Self-pay | Admitting: *Deleted

## 2014-10-10 NOTE — Progress Notes (Signed)
Letter mailed to inform patient of her normal pap results and biopsy results.  She is to follow up in one year with her annual screening mammogram.  Next pap due in 5 years.

## 2015-03-11 ENCOUNTER — Emergency Department
Admission: EM | Admit: 2015-03-11 | Discharge: 2015-03-11 | Disposition: A | Discharge status: Home / Self Care | Payer: Self-pay | Attending: Emergency Medicine | Admitting: Emergency Medicine

## 2015-03-11 DIAGNOSIS — F17200 Nicotine dependence, unspecified, uncomplicated: Secondary | ICD-10-CM | POA: Insufficient documentation

## 2015-03-11 DIAGNOSIS — Z79899 Other long term (current) drug therapy: Secondary | ICD-10-CM | POA: Insufficient documentation

## 2015-03-11 DIAGNOSIS — J0100 Acute maxillary sinusitis, unspecified: Secondary | ICD-10-CM | POA: Insufficient documentation

## 2015-03-11 DIAGNOSIS — I10 Essential (primary) hypertension: Secondary | ICD-10-CM | POA: Insufficient documentation

## 2015-03-11 DIAGNOSIS — Z7951 Long term (current) use of inhaled steroids: Secondary | ICD-10-CM | POA: Insufficient documentation

## 2015-03-11 DIAGNOSIS — Z880 Allergy status to penicillin: Secondary | ICD-10-CM | POA: Insufficient documentation

## 2015-03-11 MED ORDER — HYDROCOD POLST-CPM POLST ER 10-8 MG/5ML PO SUER
5.0000 mL | Freq: Once | ORAL | Status: AC
  Administered 2015-03-11: 5 mL via ORAL
  Filled 2015-03-11: qty 5

## 2015-03-11 MED ORDER — PROMETHAZINE-DM 6.25-15 MG/5ML PO SYRP: 5.0000 mL | ORAL_SOLUTION | Freq: Four times a day (QID) | ORAL | Status: DC | PRN

## 2015-03-11 MED ORDER — SULFAMETHOXAZOLE-TRIMETHOPRIM 800-160 MG PO TABS: 1.0000 | ORAL_TABLET | Freq: Two times a day (BID) | ORAL | Status: DC

## 2015-03-11 NOTE — Discharge Instructions (Signed)
Sinusitis, Adult  Sinusitis is redness, soreness, and puffiness (inflammation) of the air pockets in the bones of your face (sinuses). The redness, soreness, and puffiness can cause air and mucus to get trapped in your sinuses. This can allow germs to grow and cause an infection.   HOME CARE    Drink enough fluids to keep your pee (urine) clear or pale yellow.   Use a humidifier in your home.   Run a hot shower to create steam in the bathroom. Sit in the bathroom with the door closed. Breathe in the steam 3-4 times a day.   Put a warm, moist washcloth on your face 3-4 times a day, or as told by your doctor.   Use salt water sprays (saline sprays) to wet the thick fluid in your nose. This can help the sinuses drain.   Only take medicine as told by your doctor.  GET HELP RIGHT AWAY IF:    Your pain gets worse.   You have very bad headaches.   You are sick to your stomach (nauseous).   You throw up (vomit).   You are very sleepy (drowsy) all the time.   Your face is puffy (swollen).   Your vision changes.   You have a stiff neck.   You have trouble breathing.  MAKE SURE YOU:    Understand these instructions.   Will watch your condition.   Will get help right away if you are not doing well or get worse.     This information is not intended to replace advice given to you by your health care provider. Make sure you discuss any questions you have with your health care provider.     Document Released: 08/19/2007 Document Revised: 03/23/2014 Document Reviewed: 10/06/2011  [employer] Interactive Patient Education 2016 [employer]

## 2015-03-11 NOTE — ED Provider Notes (Signed)
[facility]  ____________________________________________  Time seen: Approximately 9:26 AM  I have reviewed the triage vital signs and the nursing notes.   HISTORY  Chief Complaint Cough    HPI Alexandria Waller is a 49 y.o. female patient states sinus and chest congestion for more than a week. Patient states she's taking over-the-counter sinus medicines any relief.Patient states the cough secondary to postnasal drainage. She stated there is a pressure and frontal headache. Patient also developed a sore throat 2 days ago. Patient denies nausea vomiting diarrhea. Patient rates her pain discomfort as a 4/10.   Past Medical History  Diagnosis Date  . Hypertension     There are no active problems to display for this patient.   History reviewed. No pertinent past surgical history.  Current Outpatient Rx  Name  Route  Sig  Dispense  Refill  . carvedilol (COREG) 25 MG tablet   Oral   Take 25 mg by mouth 2 (two) times daily with a meal.         . cetirizine (ZYRTEC) 10 MG tablet   Oral   Take 10 mg by mouth daily.         . fluticasone (FLONASE) 50 MCG/ACT nasal spray   Each Nare   Place 2 sprays into both nostrils daily.         . hydrochlorothiazide (HYDRODIURIL) 25 MG tablet   Oral   Take 25 mg by mouth daily.         Marland Kitchen ibuprofen (ADVIL,MOTRIN) 200 MG tablet   Oral   Take 200 mg by mouth every 6 (six) hours as needed for headache.         . ondansetron (ZOFRAN) 4 MG tablet   Oral   Take 1 tablet (4 mg total) by mouth daily as needed for nausea or vomiting.   10 tablet   1   . ondansetron (ZOFRAN) 4 MG tablet   Oral   Take 1 tablet (4 mg total) by mouth daily as needed for nausea or vomiting.   30 tablet   1   . potassium chloride (KLOR-CON) 20 MEQ packet   Oral   Take 20 mEq by mouth 2 (two) times daily.   8 tablet   0   . PRESCRIPTION MEDICATION      Migraine PRN medication- unable  to verify name of medication         . promethazine-dextromethorphan (PROMETHAZINE-DM) 6.25-15 MG/5ML syrup   Oral   Take 5 mLs by mouth 4 (four) times daily as needed for cough.   118 mL   0   . sulfamethoxazole-trimethoprim (BACTRIM DS,SEPTRA DS) 800-160 MG tablet   Oral   Take 1 tablet by mouth 2 (two) times daily.   20 tablet   0     Allergies Amoxicillin; Aspirin; and Penicillins  Family History  Problem Relation Age of Onset  . Breast cancer Sister 42    Social History Social History  Substance Use Topics  . Smoking status: Current Every Day Smoker  . Smokeless tobacco: None  . Alcohol Use: No    Review of Systems Constitutional: No fever/chills Eyes: No visual changes. ENT: Sore throat. Sinus congestion.  Cardiovascular: Denies chest pain. Respiratory: Denies shortness of breath. Nonproductive cough Gastrointestinal: No abdominal pain.  No nausea, no vomiting.  No diarrhea.  No constipation. Genitourinary: Negative for dysuria. Musculoskeletal: Negative for back pain. Skin: Negative for rash. Neurological: Positive for headaches, but denies  focal weakness or numbness. Endocrine:Hypertension Hematological/Lymphatic: Allergic/Immunilogical: Penicillin  10-point ROS otherwise negative.  ____________________________________________   PHYSICAL EXAM:  VITAL SIGNS: ED Triage Vitals  Enc Vitals Group     BP 03/11/15 0855 144/98 mmHg     Pulse Rate 03/11/15 0855 88     Resp 03/11/15 0855 18     Temp 03/11/15 0855 97.7 F (36.5 C)     Temp Source 03/11/15 0855 Oral     SpO2 03/11/15 0855 95 %     Weight 03/11/15 0855 123 lb (55.792 kg)     Height 03/11/15 0855 5\' 3"  (1.6 m)     Head Cir --      Peak Flow --      Pain Score 03/11/15 0919 4     Pain Loc --      Pain Edu? --      Excl. in GC? --     Constitutional: Alert and oriented. Well appearing and in no acute distress. Eyes: Conjunctivae are normal. PERRL. EOMI. Head: Atraumatic. Nose:  Bilateral maxillary guarding. Edematous nasal turbinates. Mouth/Throat: Mucous membranes are moist.  Oropharynx erythematous. Nonexudative tonsil but obvious postnasal drainage. Neck: No stridor.  No cervical spine tenderness to palpation. Hematological/Lymphatic/Immunilogical: No cervical lymphadenopathy. Cardiovascular: Normal rate, regular rhythm. Grossly normal heart sounds.  Good peripheral circulation. Respiratory: Normal respiratory effort.  No retractions. Lungs CTAB. Nonproductive cough Gastrointestinal: Soft and nontender. No distention. No abdominal bruits. No CVA tenderness. Musculoskeletal: No lower extremity tenderness nor edema.  No joint effusions. Neurologic:  Normal speech and language. No gross focal neurologic deficits are appreciated. No gait instability. Skin:  Skin is warm, dry and intact. No rash noted. Psychiatric: Mood and affect are normal. Speech and behavior are normal.  ____________________________________________   LABS (all labs ordered are listed, but only abnormal results are displayed)  Labs Reviewed - No data to display ____________________________________________  EKG   ____________________________________________  RADIOLOGY   ____________________________________________   PROCEDURES  Procedure(s) performed: None  Critical Care performed: No  ____________________________________________   INITIAL IMPRESSION / ASSESSMENT AND PLAN / ED COURSE  Pertinent labs & imaging results that were available during my care of the patient were reviewed by me and considered in my medical decision making (see chart for details).  Sinusitis. She given discharge Instructions. Patient given a prescription for Bactrim and promethazine DM. ____________________________________________   FINAL CLINICAL IMPRESSION(S) / ED DIAGNOSES  Final diagnoses:  Acute maxillary sinusitis, recurrence not specified      Joni ReiningRonald K Evan Osburn, PA-C 03/11/15  0935  Jene Everyobert Kinner, MD 03/11/15 [phone]

## 2015-03-11 NOTE — ED Notes (Signed)
Pt c/o sinus and chest congestion since last Monday with a HA, states she has been taking allergy meds without any relief.

## 2015-06-17 ENCOUNTER — Emergency Department
Admission: EM | Admit: 2015-06-17 | Discharge: 2015-06-17 | Disposition: A | Discharge status: Home / Self Care | Payer: Self-pay | Attending: Emergency Medicine | Admitting: Emergency Medicine

## 2015-06-17 ENCOUNTER — Encounter: Payer: Self-pay | Admitting: Emergency Medicine

## 2015-06-17 DIAGNOSIS — N289 Disorder of kidney and ureter, unspecified: Secondary | ICD-10-CM | POA: Insufficient documentation

## 2015-06-17 DIAGNOSIS — R358 Other polyuria: Secondary | ICD-10-CM | POA: Insufficient documentation

## 2015-06-17 DIAGNOSIS — E876 Hypokalemia: Secondary | ICD-10-CM | POA: Insufficient documentation

## 2015-06-17 DIAGNOSIS — E871 Hypo-osmolality and hyponatremia: Secondary | ICD-10-CM | POA: Insufficient documentation

## 2015-06-17 DIAGNOSIS — I10 Essential (primary) hypertension: Secondary | ICD-10-CM | POA: Insufficient documentation

## 2015-06-17 DIAGNOSIS — Z880 Allergy status to penicillin: Secondary | ICD-10-CM | POA: Insufficient documentation

## 2015-06-17 DIAGNOSIS — Z888 Allergy status to other drugs, medicaments and biological substances status: Secondary | ICD-10-CM | POA: Insufficient documentation

## 2015-06-17 DIAGNOSIS — R3589 Other polyuria: Secondary | ICD-10-CM

## 2015-06-17 DIAGNOSIS — Z79899 Other long term (current) drug therapy: Secondary | ICD-10-CM | POA: Insufficient documentation

## 2015-06-17 DIAGNOSIS — F17200 Nicotine dependence, unspecified, uncomplicated: Secondary | ICD-10-CM | POA: Insufficient documentation

## 2015-06-17 LAB — CBC
HCT: 40 % (ref 35.0–47.0)
HEMOGLOBIN: 13.8 g/dL (ref 12.0–16.0)
MCH: 27.7 pg (ref 26.0–34.0)
MCHC: 34.6 g/dL (ref 32.0–36.0)
MCV: 80.1 fL (ref 80.0–100.0)
Platelets: 130 10*3/uL — ABNORMAL LOW (ref 150–440)
RBC: 4.99 MIL/uL (ref 3.80–5.20)
RDW: 14.5 % (ref 11.5–14.5)
WBC: 6.4 10*3/uL (ref 3.6–11.0)

## 2015-06-17 LAB — URINALYSIS COMPLETE WITH MICROSCOPIC (ARMC ONLY)
Bilirubin Urine: NEGATIVE
Glucose, UA: NEGATIVE mg/dL
Ketones, ur: NEGATIVE mg/dL
Nitrite: NEGATIVE
PROTEIN: 30 mg/dL — AB
SPECIFIC GRAVITY, URINE: 1.017 (ref 1.005–1.030)
TRANS EPITHEL UA: 2
pH: 5 (ref 5.0–8.0)

## 2015-06-17 LAB — HEPATIC FUNCTION PANEL
ALT: 29 U/L (ref 14–54)
AST: 41 U/L (ref 15–41)
Albumin: 4.4 g/dL (ref 3.5–5.0)
Alkaline Phosphatase: 109 U/L (ref 38–126)
BILIRUBIN TOTAL: 0.7 mg/dL (ref 0.3–1.2)
Total Protein: 8.2 g/dL — ABNORMAL HIGH (ref 6.5–8.1)

## 2015-06-17 LAB — BASIC METABOLIC PANEL
ANION GAP: 16 — AB (ref 5–15)
Anion gap: 5 (ref 5–15)
BUN: 26 mg/dL — ABNORMAL HIGH (ref 6–20)
BUN: 19 mg/dL (ref 6–20)
CALCIUM: 9.6 mg/dL (ref 8.9–10.3)
CALCIUM: 7.6 mg/dL — AB (ref 8.9–10.3)
CO2: 24 mmol/L (ref 22–32)
CO2: 24 mmol/L (ref 22–32)
CREATININE: 1.11 mg/dL — AB (ref 0.44–1.00)
Chloride: 89 mmol/L — ABNORMAL LOW (ref 101–111)
Chloride: 106 mmol/L (ref 101–111)
Creatinine, Ser: 1.38 mg/dL — ABNORMAL HIGH (ref 0.44–1.00)
GFR calc non Af Amer: 56 mL/min — ABNORMAL LOW (ref >60)
GFR, EST AFRICAN AMERICAN: 50 mL/min — AB (ref >60)
GFR, EST NON AFRICAN AMERICAN: 43 mL/min — AB (ref >60)
Glucose, Bld: 120 mg/dL — ABNORMAL HIGH (ref 65–99)
Glucose, Bld: 88 mg/dL (ref 65–99)
Potassium: 2.1 mmol/L — CL (ref 3.5–5.1)
Potassium: 2.9 mmol/L — CL (ref 3.5–5.1)
SODIUM: 129 mmol/L — AB (ref 135–145)
SODIUM: 135 mmol/L (ref 135–145)

## 2015-06-17 LAB — TSH: TSH: 2.918 u[IU]/mL (ref 0.350–4.500)

## 2015-06-17 MED ORDER — SODIUM CHLORIDE 0.9 % IV BOLUS (SEPSIS)
1000.0000 mL | Freq: Once | INTRAVENOUS | Status: AC
  Administered 2015-06-17: 1000 mL via INTRAVENOUS

## 2015-06-17 MED ORDER — POTASSIUM CHLORIDE 10 MEQ/100ML IV SOLN
10.0000 meq | INTRAVENOUS | Status: AC
  Administered 2015-06-17 (×4): 10 meq via INTRAVENOUS
  Filled 2015-06-17 (×4): qty 100

## 2015-06-17 MED ORDER — ONDANSETRON HCL 4 MG/2ML IJ SOLN
4.0000 mg | Freq: Once | INTRAMUSCULAR | Status: AC
  Administered 2015-06-17: 4 mg via INTRAVENOUS
  Filled 2015-06-17: qty 2

## 2015-06-17 MED ORDER — POTASSIUM CHLORIDE CRYS ER 20 MEQ PO TBCR
40.0000 meq | EXTENDED_RELEASE_TABLET | Freq: Once | ORAL | Status: AC
  Administered 2015-06-17: 40 meq via ORAL
  Filled 2015-06-17: qty 2

## 2015-06-17 MED ORDER — MECLIZINE HCL 25 MG PO TABS
25.0000 mg | ORAL_TABLET | Freq: Once | ORAL | Status: AC
  Administered 2015-06-17: 25 mg via ORAL
  Filled 2015-06-17: qty 1

## 2015-06-17 NOTE — ED Notes (Signed)
Orthostatic Vital Signs:   Lying:       HR: 86, BP: 125/86 Sitting:     HR: 96, BP 130/91 Standing: HR: 92, BP: 130/88

## 2015-06-17 NOTE — ED Provider Notes (Signed)
[facility] Emergency Department Provider Note  ____________________________________________  Time seen: Approximately 2:40 PM  I have reviewed the triage vital signs and the nursing notes.   HISTORY  Chief Complaint Dizziness    HPI Alexandria Waller is a 50 y.o. female with a history of HTN on HCTZ without any recent changes in her medications presenting with lightheadedness, nausea without vomiting. Patient reports that for 4 days she has had intermittent lightheadedness which usually occurs with standing. It resolves if she sits down. Initially, she felt that it was similar to previous vertigo she had had but it is not room spinning dizziness that is a lightheadedness and presyncope. She denies any chest pain, palpitations, shortness of breath, cough or cold symptoms, fever or chills. She does have associated nausea and decreased appetite, without vomiting. No diarrhea. No known sick contacts. No headache, numbness tingling or weakness, visual changes or changes in her speech, no changes in her mental status.   Past Medical History  Diagnosis Date  . Hypertension     There are no active problems to display for this patient.   History reviewed. No pertinent past surgical history.  Current Outpatient Rx  Name  Route  Sig  Dispense  Refill  . carvedilol (COREG) 25 MG tablet   Oral   Take 25 mg by mouth 2 (two) times daily with a meal.         . cetirizine (ZYRTEC) 10 MG tablet   Oral   Take 10 mg by mouth daily.         . fluticasone (FLONASE) 50 MCG/ACT nasal spray   Each Nare   Place 2 sprays into both nostrils daily.         . ondansetron (ZOFRAN) 4 MG tablet   Oral   Take 1 tablet (4 mg total) by mouth daily as needed for nausea or vomiting.   10 tablet   1   . ondansetron (ZOFRAN) 4 MG tablet   Oral   Take 1 tablet (4 mg total) by mouth daily as needed for nausea or vomiting.   30 tablet   1   . potassium chloride (KLOR-CON)  20 MEQ packet   Oral   Take 20 mEq by mouth 2 (two) times daily.   8 tablet   0   . PRESCRIPTION MEDICATION      Migraine PRN medication- unable to verify name of medication         . promethazine-dextromethorphan (PROMETHAZINE-DM) 6.25-15 MG/5ML syrup   Oral   Take 5 mLs by mouth 4 (four) times daily as needed for cough.   118 mL   0   . sulfamethoxazole-trimethoprim (BACTRIM DS,SEPTRA DS) 800-160 MG tablet   Oral   Take 1 tablet by mouth 2 (two) times daily.   20 tablet   0     Allergies Amoxicillin; Aspirin; and Penicillins  Family History  Problem Relation Age of Onset  . Breast cancer Sister [id number]    Social History Social History  Substance Use Topics  . Smoking status: Current Every Day Smoker  . Smokeless tobacco: None  . Alcohol Use: No    Review of Systems Constitutional: No fever/chills. Positive postural lightheadedness. Negative syncope. Eyes: No visual changes. Chronic stigmatism causing medial gaze of her left eye. No blurred or double vision. ENT: No sore throat. No congestion or rhinorrhea. Cardiovascular: Denies chest pain. Denies palpitations. Respiratory: Denies shortness of breath.  No cough. Gastrointestinal: No abdominal pain. Positive decreased  appetite. Positive nausea, no vomiting.  No diarrhea.  No constipation. Genitourinary: Negative for dysuria. Musculoskeletal: Negative for back pain. Skin: Negative for rash. Neurological: Negative for headaches. No focal numbness, tingling or weakness.   10-point ROS otherwise negative.  ____________________________________________   PHYSICAL EXAM:  VITAL SIGNS: ED Triage Vitals  Enc Vitals Group     BP 06/17/15 1335 121/84 mmHg     Pulse Rate 06/17/15 1335 94     Resp 06/17/15 1335 16     Temp 06/17/15 1335 98.3 F (36.8 C)     Temp Source 06/17/15 1335 Oral     SpO2 06/17/15 1335 100 %     Weight 06/17/15 1335 125 lb (56.7 kg)     Height 06/17/15 1335 5' (1.524 m)     Head Cir --       Peak Flow --      Pain Score --      Pain Loc --      Pain Edu? --      Excl. in GC? --     Constitutional: Alert and oriented. Well appearing and in no acute distress. Answers questions appropriately. Eyes: Conjunctivae are normal.  EOMI. No scleral icterus. Medial gaze at rest of the left eye. Head: Atraumatic. Nose: No congestion/rhinnorhea. Mouth/Throat: Mucous membranes are dry.  Neck: No stridor.  Supple.  No JVD. No meningismus. Cardiovascular: Normal rate, regular rhythm. No murmurs, rubs or gallops.  Respiratory: Normal respiratory effort.  No accessory muscle use or retractions. Lungs CTAB.  No wheezes, rales or ronchi. Gastrointestinal: Soft, nontender and nondistended.  No guarding or rebound.  No peritoneal signs. Musculoskeletal: No LE edema. No ttp in the calves or palpable cords.  Negative Homan's sign. Neurologic:  A&Ox3.  Speech is clear.  Face and smile are symmetric.  EOMI.  Moves all extremities well. Skin:  Skin is warm, dry and intact. No rash noted. Psychiatric: Mood and affect are normal. Speech and behavior are normal.  Normal judgement  ____________________________________________   LABS (all labs ordered are listed, but only abnormal results are displayed)  Labs Reviewed  BASIC METABOLIC PANEL - Abnormal; Notable for the following:    Sodium 129 (*)    Potassium 2.1 (*)    Chloride 89 (*)    Glucose, Bld 120 (*)    BUN 26 (*)    Creatinine, Ser 1.38 (*)    GFR calc non Af Amer 43 (*)    GFR calc Af Amer 50 (*)    Anion gap 16 (*)    All other components within normal limits  CBC - Abnormal; Notable for the following:    Platelets 130 (*)    All other components within normal limits  URINALYSIS COMPLETEWITH MICROSCOPIC ([facility] ONLY) - Abnormal; Notable for the following:    Color, Urine YELLOW (*)    APPearance HAZY (*)    Hgb urine dipstick 1+ (*)    Protein, ur 30 (*)    Leukocytes, UA 1+ (*)    Bacteria, UA RARE (*)    Squamous  Epithelial / LPF 6-30 (*)    All other components within normal limits  HEPATIC FUNCTION PANEL - Abnormal; Notable for the following:    Total Protein 8.2 (*)    Bilirubin, Direct <0.1 (*)    All other components within normal limits  BASIC METABOLIC PANEL - Abnormal; Notable for the following:    Potassium 2.9 (*)    Creatinine, Ser 1.11 (*)    Calcium  7.6 (*)    GFR calc non Af Amer 56 (*)    All other components within normal limits  TSH   ____________________________________________  EKG  ED ECG REPORT I, Rockne Menghini, the attending physician, personally viewed and interpreted this ECG.   Date: 06/17/2015  EKG Time: 1338  Rate: 93  Rhythm: normal sinus rhythm  Axis: Normal intervals.  Intervals:none  ST&T Change: No ST changes.  ____________________________________________  RADIOLOGY  No results found.  ____________________________________________   PROCEDURES  Procedure(s) performed: None  Critical Care performed: No ____________________________________________   INITIAL IMPRESSION / ASSESSMENT AND PLAN / ED COURSE  Pertinent labs & imaging results that were available during my care of the patient were reviewed by me and considered in my medical decision making (see chart for details).  50 y.o. F w/ hx of HTN on HCTZ presenting w/ postural presyncopal episodes w/ nausea but no other associated sx's.  The patient's initial vital signs are normal and her exam is reassuring. There are no abnormal focal neurologic deficits nor cardiopulmonary abnormalities.  Her labs however grossly abnormal. She has a potassium of 2.1 and a sodium of 129, as well as a mild bump in her creatinine to 1.38 and an elevated BUN. It is possible that these changes are related to her HCTZ and decreased by mouth intake, so I will plan to supplement her and give her IV fluids. We also get orthostatic vital signs to see if her postural lightheadedness is due to  orthostasis.  ----------------------------------------- 4:51 PM on 06/17/2015 -----------------------------------------  The patient is not orthostatic. However, she has continued to feel lightheaded even when she is in the stretcher. She is receiving her fluids, and her potassium supplementation, then we'll recheck her basic metabolic panel.  ----------------------------------------- 7:46 PM on 06/17/2015 -----------------------------------------  The patient states that she is feeling much better after her IV fluids. At this point in her workup, the most likely etiology of her symptoms is over diuresis from her HCTZ. She was not orthostatic on exam, but her postural lightheadedness associated with hyponatremia and hypokalemia, as well as mild renal insufficiency is most consistent with dehydration. I'll plan to have her hold that medication until she is able to talk to her primary care physician tomorrow.  ----------------------------------------- 8:58 PM on 06/17/2015 -----------------------------------------  The patient's renal insufficiency and her sodium have significantly improved after fluid. Her potassium is also trending upwards. I will have her stop her diuretic, and follow up with her primary care physician tomorrow. Overall, the patient is clinically feeling much improved. ____________________________________________  FINAL CLINICAL IMPRESSION(S) / ED DIAGNOSES  Final diagnoses:  Hypokalemia  Hyponatremia  Acute renal insufficiency  Diuresis excessive      NEW MEDICATIONS STARTED DURING THIS VISIT:  New Prescriptions   No medications on file     Rockne Menghini, MD 06/17/15 2058

## 2015-06-17 NOTE — ED Notes (Signed)
Called pharmacy for meds

## 2015-06-17 NOTE — Discharge Instructions (Signed)
Please hold your HCTZ medication until you have been instructed to restart it by your regular doctor.  Do not take any NSAID medications, such as Ibuprofen, as this may worsen your kidney function.  Make an appointment with your primary care doctor TOMORROW to have your bloodwork rechecked, and your symptoms re-evaluated.  Return to the emergency department for severe pain, fainting or near fainting, chest pain, or any other symptoms concerning to you.

## 2015-06-17 NOTE — ED Notes (Signed)
Pt reports nausea, dizziness and chills x3 days.

## 2016-03-02 ENCOUNTER — Emergency Department
Admission: EM | Admit: 2016-03-02 | Discharge: 2016-03-02 | Disposition: A | Discharge status: Home / Self Care | Payer: Self-pay | Attending: Emergency Medicine | Admitting: Emergency Medicine

## 2016-03-02 DIAGNOSIS — J069 Acute upper respiratory infection, unspecified: Secondary | ICD-10-CM | POA: Insufficient documentation

## 2016-03-02 DIAGNOSIS — I10 Essential (primary) hypertension: Secondary | ICD-10-CM | POA: Insufficient documentation

## 2016-03-02 DIAGNOSIS — Z87891 Personal history of nicotine dependence: Secondary | ICD-10-CM | POA: Insufficient documentation

## 2016-03-02 DIAGNOSIS — J019 Acute sinusitis, unspecified: Secondary | ICD-10-CM | POA: Insufficient documentation

## 2016-03-02 DIAGNOSIS — Z79899 Other long term (current) drug therapy: Secondary | ICD-10-CM | POA: Insufficient documentation

## 2016-03-02 LAB — POCT RAPID STREP A: Streptococcus, Group A Screen (Direct): NEGATIVE

## 2016-03-02 MED ORDER — AZITHROMYCIN 250 MG PO TABS: 250.0000 mg | ORAL_TABLET | Freq: Every day | ORAL | 0 refills | Status: DC

## 2016-03-02 MED ORDER — AZITHROMYCIN 500 MG PO TABS
500.0000 mg | ORAL_TABLET | Freq: Once | ORAL | Status: AC
  Administered 2016-03-02: 500 mg via ORAL
  Filled 2016-03-02: qty 1

## 2016-03-02 NOTE — ED Triage Notes (Signed)
Pt presents to ED with c/o cough, sore throat and headache since Saturday. Pt reports (+) N/V Saturday night and Sunday morning, but none since. Pt states cough is non-productive, (+) chest congestion; fever at home tmax of 100.3. Pt also reports left-sided HA that is similar to her h/x of migraines; denies visual changes, denies unilateral weakness of numbness.

## 2016-03-02 NOTE — ED Provider Notes (Signed)
[facility] Emergency Department Provider Note  Time seen: 7:42 AM  I have reviewed the triage vital signs and the nursing notes.   HISTORY  Chief Complaint Fever; Cough; and Sore Throat    HPI Alexandria Waller is a 50 y.o. female with a past medical history of hypertension who presents to the emergency department with cough, congestion and fever. According to the patient for the past 1.5 weeks she has been coughing with nasal congestion, sinus congestion and subjective fevers. States she was feeling better however 2 days ago she once again began coughing with congestion. States she was up coughing last night so she came to the emergency department for evaluation. States the fever has gone away. Denies any trouble breathing. Denies any chest pain. Denies nausea or vomiting. Denies abdominal pain.  Past Medical History:  Diagnosis Date  . Hypertension     There are no active problems to display for this patient.   History reviewed. No pertinent surgical history.  Prior to Admission medications   Medication Sig Start Date End Date Taking? Authorizing Provider  carvedilol (COREG) 25 MG tablet Take 25 mg by mouth 2 (two) times daily with a meal.    Historical Provider, MD  cetirizine (ZYRTEC) 10 MG tablet Take 10 mg by mouth daily.    Historical Provider, MD  fluticasone (FLONASE) 50 MCG/ACT nasal spray Place 2 sprays into both nostrils daily.    Historical Provider, MD  potassium chloride (KLOR-CON) 20 MEQ packet Take 20 mEq by mouth 2 (two) times daily. 07/17/14   Darien Ramusavid W Kaminski, MD  PRESCRIPTION MEDICATION Migraine PRN medication- unable to verify name of medication    Historical Provider, MD  promethazine-dextromethorphan (PROMETHAZINE-DM) 6.25-15 MG/5ML syrup Take 5 mLs by mouth 4 (four) times daily as needed for cough. 03/11/15   Joni Reiningonald K Smith, PA-C  sulfamethoxazole-trimethoprim (BACTRIM DS,SEPTRA DS) 800-160 MG tablet Take 1 tablet by mouth 2 (two)  times daily. 03/11/15   Joni Reiningonald K Smith, PA-C    Allergies  Allergen Reactions  . Amoxicillin Nausea And Vomiting  . Aspirin Nausea And Vomiting  . Penicillins Nausea And Vomiting    Family History  Problem Relation Age of Onset  . Breast cancer Sister [id number]    Social History Social History  Substance Use Topics  . Smoking status: Former Games developermoker  . Smokeless tobacco: Never Used  . Alcohol use No    Review of Systems Constitutional: Subjective fevers, now resolved. Positive for congestion. Cardiovascular: Negative for chest pain. Respiratory: Negative for shortness of breath. Positive for cough. No sputum production. Gastrointestinal: Negative for abdominal pain, vomiting and diarrhea. Neurological: Mild intermittent headache. No focal weakness or numbness. 10-point ROS otherwise negative.  ____________________________________________   PHYSICAL EXAM:  VITAL SIGNS: ED Triage Vitals  Enc Vitals Group     BP 03/02/16 0404 (!) 171/115     Pulse Rate 03/02/16 0404 92     Resp 03/02/16 0404 16     Temp 03/02/16 0404 98.7 F (37.1 C)     Temp Source 03/02/16 0404 Oral     SpO2 03/02/16 0404 97 %     Weight 03/02/16 0404 126 lb (57.2 kg)     Height 03/02/16 0404 5\' 3"  (1.6 m)     Head Circumference --      Peak Flow --      Pain Score 03/02/16 0405 8     Pain Loc --      Pain Edu? --  Excl. in GC? --     Constitutional: Alert and oriented. Well appearing and in no distress. Eyes: Normal exam ENT   Head: Normocephalic and atraumatic.   Mouth/Throat: Mucous membranes are moist.Moderate nasal congestion. Cardiovascular: Normal rate, regular rhythm. No murmur Respiratory: Normal respiratory effort without tachypnea nor retractions. Breath sounds are clear  Gastrointestinal: Soft and nontender. No distention.  Musculoskeletal: Nontender with normal range of motion in all extremities. Neurologic:  Normal speech and language. No gross focal neurologic deficits   Skin:  Skin is warm, dry and intact.  Psychiatric: Mood and affect are normal.   ____________________________________________   INITIAL IMPRESSION / ASSESSMENT AND PLAN / ED COURSE  Pertinent labs & imaging results that were available during my care of the patient were reviewed by me and considered in my medical decision making (see chart for details).  Strep test is negative. Patient presents with subjective fever, cough and congestion for the past 1.5 weeks. Clear lung sounds bilaterally. Normal vitals. Overall the patient appears well, suspected degree of bronchitis as well as sinusitis. As his symptoms have been ongoing greater than 7 days I will cover with antibiotics. Patient will follow up with primary care doctor in 2-3 days for recheck if she remains symptomatic. Provided my normal URI return precautions.  ____________________________________________   FINAL CLINICAL IMPRESSION(S) / ED DIAGNOSES  Upper respiratory infection Sinusitis    Minna AntisKevin Zoey Bidwell, MD 03/02/16 [id number]

## 2016-03-02 NOTE — ED Notes (Signed)
Pt placed in ER RM 16 after waiting in the lobby for four hours with no blood work completed. NAD. Pt ambulatory to tx room with NAD.

## 2016-03-04 LAB — CULTURE, GROUP A STREP (THRC)

## 2016-04-11 ENCOUNTER — Emergency Department: Payer: Self-pay

## 2016-04-11 ENCOUNTER — Emergency Department
Admission: EM | Admit: 2016-04-11 | Discharge: 2016-04-11 | Disposition: A | Discharge status: Home / Self Care | Payer: Self-pay | Attending: Emergency Medicine | Admitting: Emergency Medicine

## 2016-04-11 ENCOUNTER — Encounter: Payer: Self-pay | Admitting: *Deleted

## 2016-04-11 DIAGNOSIS — M779 Enthesopathy, unspecified: Secondary | ICD-10-CM | POA: Insufficient documentation

## 2016-04-11 DIAGNOSIS — Y999 Unspecified external cause status: Secondary | ICD-10-CM | POA: Insufficient documentation

## 2016-04-11 DIAGNOSIS — Y929 Unspecified place or not applicable: Secondary | ICD-10-CM | POA: Insufficient documentation

## 2016-04-11 DIAGNOSIS — Z87891 Personal history of nicotine dependence: Secondary | ICD-10-CM | POA: Insufficient documentation

## 2016-04-11 DIAGNOSIS — I10 Essential (primary) hypertension: Secondary | ICD-10-CM | POA: Insufficient documentation

## 2016-04-11 DIAGNOSIS — M778 Other enthesopathies, not elsewhere classified: Secondary | ICD-10-CM

## 2016-04-11 DIAGNOSIS — W2201XA Walked into wall, initial encounter: Secondary | ICD-10-CM | POA: Insufficient documentation

## 2016-04-11 DIAGNOSIS — S63601A Unspecified sprain of right thumb, initial encounter: Secondary | ICD-10-CM | POA: Insufficient documentation

## 2016-04-11 DIAGNOSIS — Z79899 Other long term (current) drug therapy: Secondary | ICD-10-CM | POA: Insufficient documentation

## 2016-04-11 DIAGNOSIS — Y939 Activity, unspecified: Secondary | ICD-10-CM | POA: Insufficient documentation

## 2016-04-11 DIAGNOSIS — M79644 Pain in right finger(s): Secondary | ICD-10-CM

## 2016-04-11 MED ORDER — NAPROXEN 500 MG PO TABS: 500.0000 mg | ORAL_TABLET | Freq: Two times a day (BID) | ORAL | 0 refills | Status: DC

## 2016-04-11 NOTE — ED Provider Notes (Signed)
[facility] Emergency Department Provider Note  ____________________________________________   First MD Initiated Contact with Patient 04/11/16 1154     (approximate)  I have reviewed the triage vital signs and the nursing notes.   HISTORY  Chief Complaint Hand Injury    HPI Alexandria Waller is a 51 y.o. female patient complain right hand pain secondary to hitting head against a wall 3 days ago. Patient states she's having decreased range of motion with flexion of the right thumb. Patient state there is a "popping" sensation when she flexes the thumb. Patient denies thumb pain Loc any position. Patient is right-hand dominant. Patient rates pain as a 6/10. Patient described a pain as "achy".   Past Medical History:  Diagnosis Date  . Hypertension     There are no active problems to display for this patient.   History reviewed. No pertinent surgical history.  Prior to Admission medications   Medication Sig Start Date End Date Taking? Authorizing Provider  azithromycin (ZITHROMAX) 250 MG tablet Take 1 tablet (250 mg total) by mouth daily. 03/02/16   Minna Antis, MD  carvedilol (COREG) 25 MG tablet Take 25 mg by mouth 2 (two) times daily with a meal.    Historical Provider, MD  cetirizine (ZYRTEC) 10 MG tablet Take 10 mg by mouth daily.    Historical Provider, MD  fluticasone (FLONASE) 50 MCG/ACT nasal spray Place 2 sprays into both nostrils daily.    Historical Provider, MD  naproxen (NAPROSYN) 500 MG tablet Take 1 tablet (500 mg total) by mouth 2 (two) times daily with a meal. 04/11/16   Joni Reining, PA-C  potassium chloride (KLOR-CON) 20 MEQ packet Take 20 mEq by mouth 2 (two) times daily. 07/17/14   Darien Ramus, MD  PRESCRIPTION MEDICATION Migraine PRN medication- unable to verify name of medication    Historical Provider, MD  promethazine-dextromethorphan (PROMETHAZINE-DM) 6.25-15 MG/5ML syrup Take 5 mLs by mouth 4 (four) times daily as  needed for cough. 03/11/15   Joni Reining, PA-C  sulfamethoxazole-trimethoprim (BACTRIM DS,SEPTRA DS) 800-160 MG tablet Take 1 tablet by mouth 2 (two) times daily. 03/11/15   Joni Reining, PA-C    Allergies Amoxicillin; Aspirin; and Penicillins  Family History  Problem Relation Age of Onset  . Breast cancer Sister 43    Social History Social History  Substance Use Topics  . Smoking status: Former Games developer  . Smokeless tobacco: Never Used  . Alcohol use No    Review of Systems Constitutional: No fever/chills Eyes: No visual changes. ENT: No sore throat. Cardiovascular: Denies chest pain. Respiratory: Denies shortness of breath. Gastrointestinal: No abdominal pain.  No nausea, no vomiting.  No diarrhea.  No constipation. Genitourinary: Negative for dysuria. Musculoskeletal: Right thumb pain  Skin: Negative for rash. Neurological: Negative for headaches, focal weakness or numbness. Endocrine:Hypertension Hematological/Lymphatic: Allergic/Immunilogical: See medication list.  ____________________________________________   PHYSICAL EXAM:  VITAL SIGNS: ED Triage Vitals  Enc Vitals Group     BP 04/11/16 1134 (!) 138/96     Pulse Rate 04/11/16 1134 77     Resp 04/11/16 1134 16     Temp 04/11/16 1134 98.2 F (36.8 C)     Temp Source 04/11/16 1134 Oral     SpO2 04/11/16 1134 99 %     Weight 04/11/16 1134 125 lb (56.7 kg)     Height 04/11/16 1134 5\' 3"  (1.6 m)     Head Circumference --      Peak Flow --  Pain Score 04/11/16 1135 6     Pain Loc --      Pain Edu? --      Excl. in GC? --     Constitutional: Alert and oriented. Well appearing and in no acute distress. Eyes: Conjunctivae are normal. PERRL. EOMI. Head: Atraumatic. Nose: No congestion/rhinnorhea. Mouth/Throat: Mucous membranes are moist.  Oropharynx non-erythematous. Neck: No stridor.  No cervical spine tenderness to palpation. Hematological/Lymphatic/Immunilogical: No cervical  lymphadenopathy. Cardiovascular: Normal rate, regular rhythm. Grossly normal heart sounds.  Good peripheral circulation. Respiratory: Normal respiratory effort.  No retractions. Lungs CTAB. Gastrointestinal: Soft and nontender. No distention. No abdominal bruits. No CVA tenderness. Musculoskeletal: No obvious deformity edema or ecchymosis. Patient is nontender palpation. There is audible "popping" with flexion and extension of the right thumb.  Neurologic:  Normal speech and language. No gross focal neurologic deficits are appreciated. No gait instability. Skin:  Skin is warm, dry and intact. No rash noted. Psychiatric: Mood and affect are normal. Speech and behavior are normal.  ____________________________________________   LABS (all labs ordered are listed, but only abnormal results are displayed)  Labs Reviewed - No data to display ____________________________________________  EKG   ____________________________________________  RADIOLOGY  No acute findings on x-ray of the right thumb. ____________________________________________   PROCEDURES  Procedure(s) performed: None  Procedures  Critical Care performed: No  ____________________________________________   INITIAL IMPRESSION / ASSESSMENT AND PLAN / ED COURSE  Pertinent labs & imaging results that were available during my care of the patient were reviewed by me and considered in my medical decision making (see chart for details). Tendinitis and sprain to the right thumb. Discussed neck x-ray finding with patient. Patient placed in a thumb spica splint and advised to follow-up with orthopedics in 7-10 days if no improvement.      ____________________________________________   FINAL CLINICAL IMPRESSION(S) / ED DIAGNOSES  Final diagnoses:  Thumb pain, right  Tendinitis of right hand      NEW MEDICATIONS STARTED DURING THIS VISIT:  New Prescriptions   NAPROXEN (NAPROSYN) 500 MG TABLET    Take 1 tablet  (500 mg total) by mouth 2 (two) times daily with a meal.     Note:  This document was prepared using Dragon voice recognition software and may include unintentional dictation errors.    Joni Reiningonald K Mikiyah Glasner, PA-C 04/11/16 1212    Minna AntisKevin Paduchowski, MD 04/11/16 [phone]

## 2016-04-11 NOTE — ED Notes (Signed)
Pt verbalized understanding of discharge instructions. NAD at this time. 

## 2016-04-11 NOTE — ED Triage Notes (Signed)
Pt reports having right hand pain. Pt reports having hit her hand against the wall three days ago. No swelling noted not brusing. Pt reports pain in thumb and "poping" with movement. Pt reports pain increases with bending thumb. Pain located around thumb.

## 2016-04-11 NOTE — Discharge Instructions (Signed)
Wear splint and take medication as directed. Follow orthopedics no improvement in one week.

## 2016-10-12 IMAGING — US US BREAST LTD UNI LEFT INC AXILLA
1 series · 7 of 7 positions shown · non-contrast
Comparison: Baseline evaluation 08/29/2014

CLINICAL DATA: Patient recalled from screening for left breast
mass.

EXAM:
DIGITAL DIAGNOSTIC LEFT MAMMOGRAM WITH 3D TOMOSYNTHESIS WITH CAD
ULTRASOUND LEFT BREAST

[Series 1: us breast ltd uni left inc axilla · 0.08mm/px · 7 of 7 slices shown]
[im 1/7]
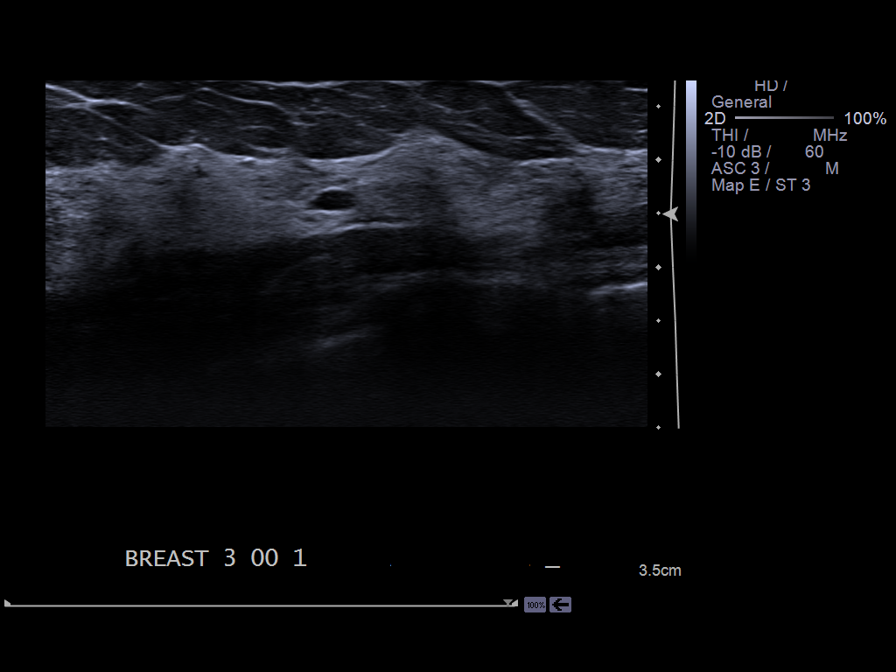
[im 2/7]
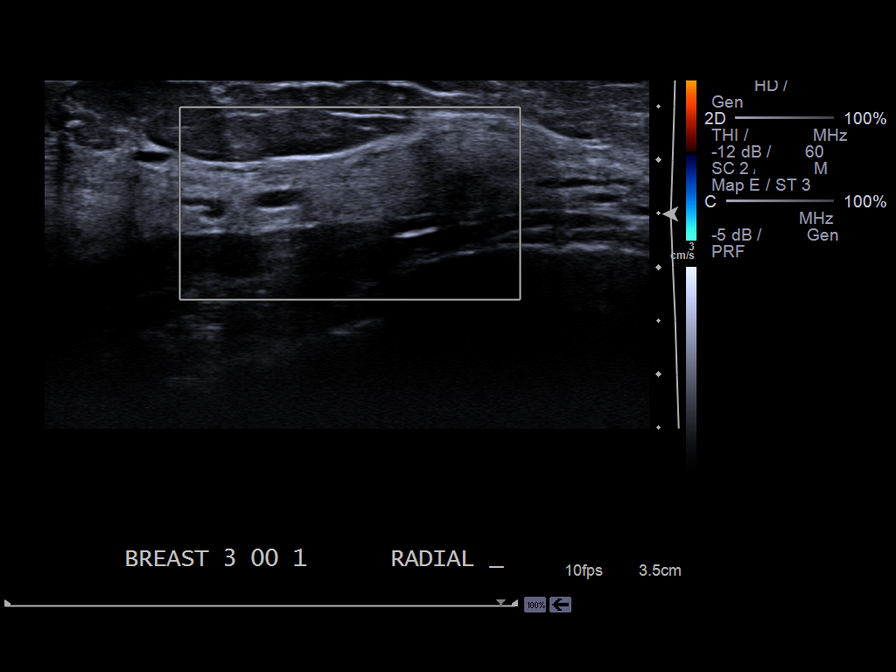
[im 3/7]
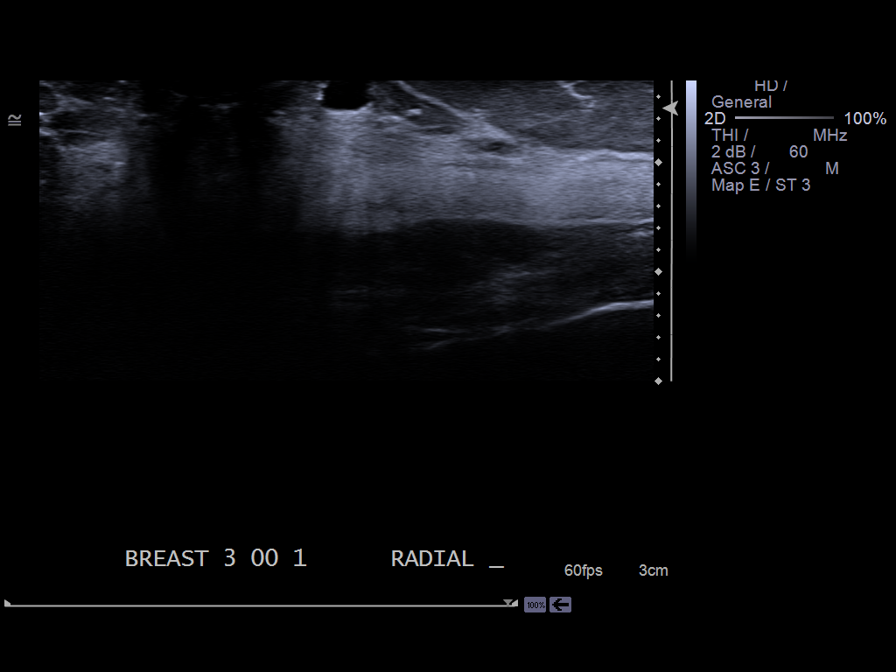
[im 4/7]
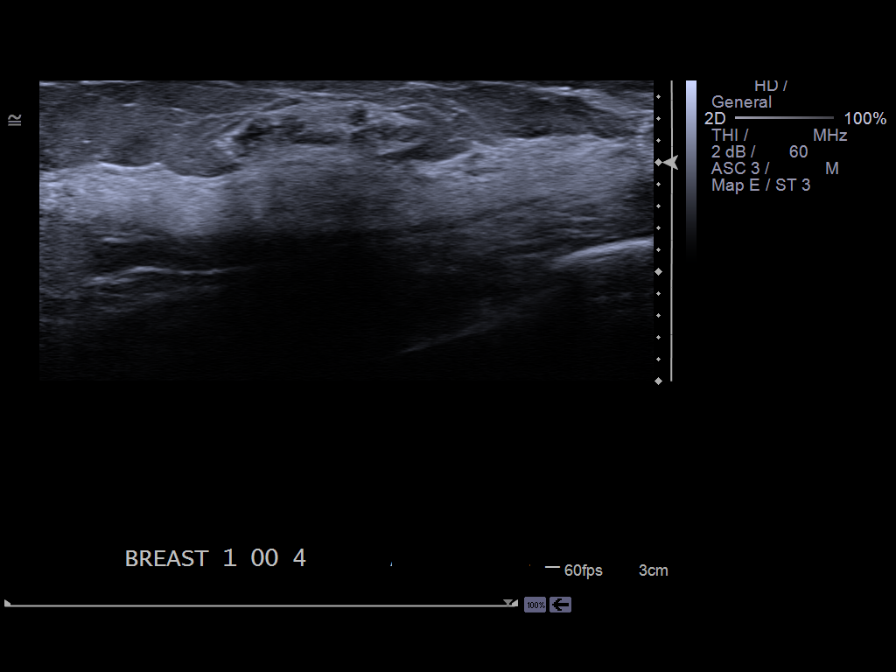
[im 5/7]
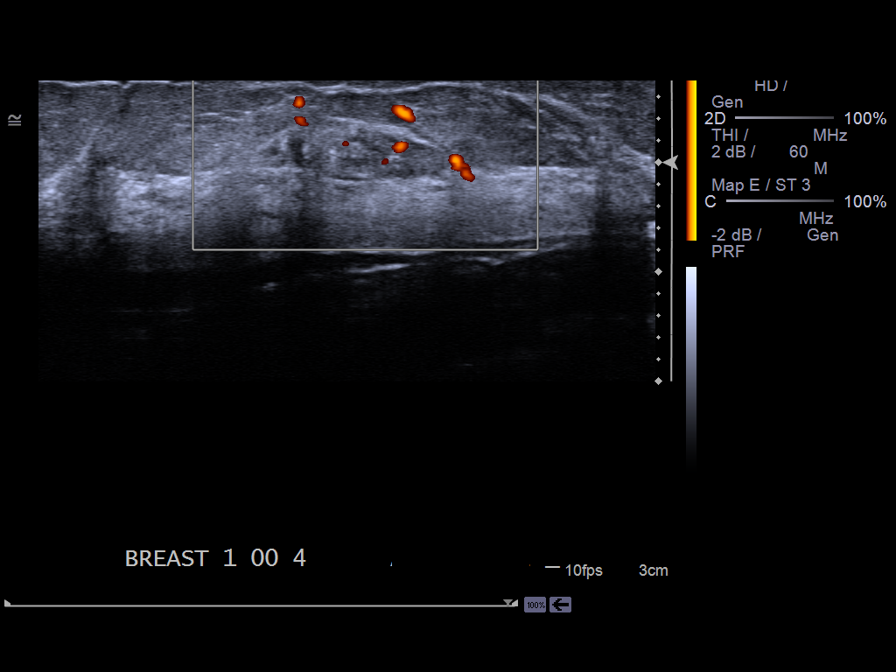
[im 6/7]
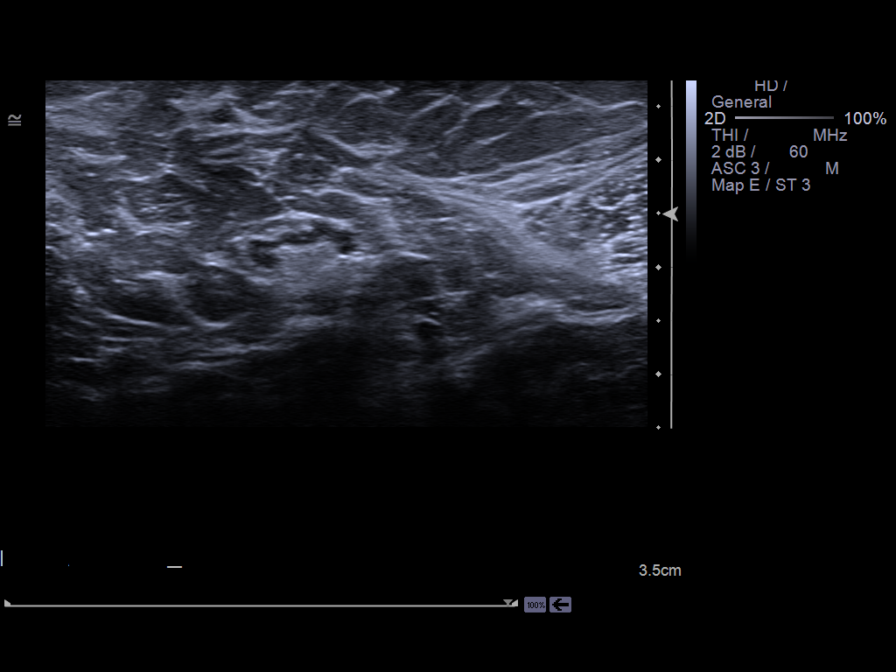
[im 7/7]
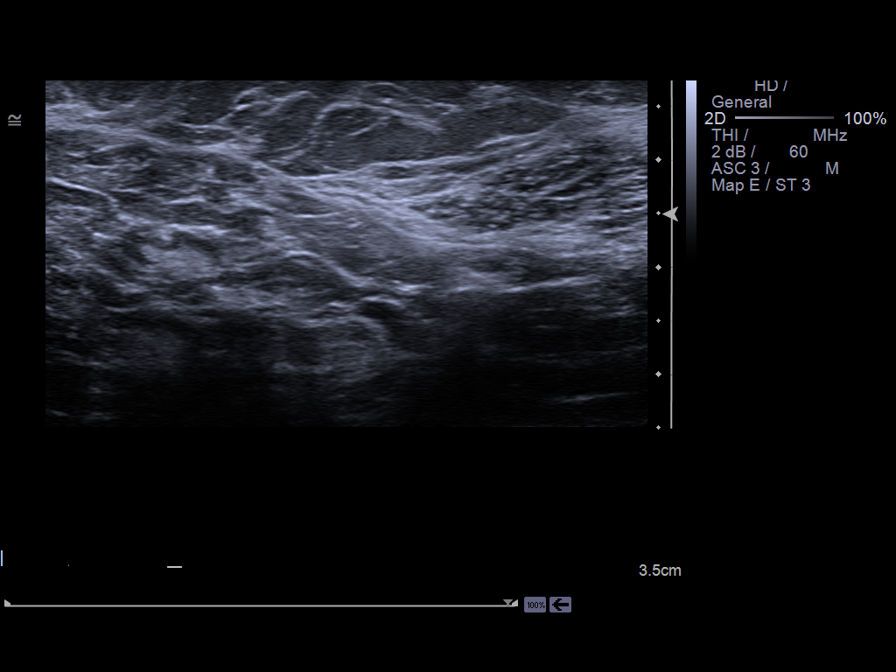

[7 of 7 positions shown; findings below may reference images not displayed]

ACR Breast Density Category c: The breast tissue is heterogeneously
dense, which may obscure small masses.
FINDINGS: Within the upper-outer left breast posterior depth there is a 1.5 cm
oval circumscribed mass. Additionally within the left breast 3
o'clock position subareolar location there is a 5 mm oval
circumscribed mass. No enlarged lymph nodes demonstrated on
additional imaging within the left axilla.

Mammographic images were processed with CAD.

On physical exam, I palpate no discrete mass within the upper-outer
left breast.

Targeted ultrasound is performed, showing a predominately
hyperechoic yet mixed echogenicity circumscribed mass within the
left breast 1 o'clock position 4 cm from the nipple with multiple
areas of internal hypo echogenicity. There is internal color flow
within this mass. This mass measures 2.0 x 1.0 x 0.6 cm.

Additionally within the left breast 3 o'clock position 1 cm from the
nipple there is 5 x 3 x 4 mm simple cyst in adjacent 5 x 4 x 3 mm
simple cyst.

No enlarged left axillary lymph nodes identified.
IMPRESSION: Indeterminate mixed echogenicity mass within the left breast 1
o'clock position 4 cm from the nipple.

No left axillary lymphadenopathy.

RECOMMENDATION:
Ultrasound-guided core needle biopsy left breast mass 1 o'clock
position for definitive characterization. Patient reports prior
outside images at [facility] however at this time the facility is
unable to locate the images. If the patient is able to have these
images located prior to scheduled biopsy date, they will be compared
with the current evaluation.

Biopsy will be scheduled at patient's convenience.

I have discussed the findings and recommendations with the patient.
Results were also provided in writing at the conclusion of the
visit. If applicable, a reminder letter will be sent to the patient
regarding the next appointment.

BI-RADS CATEGORY  4: Suspicious.

## 2016-10-14 IMAGING — US US BREAST BX W LOC DEV 1ST LESION IMG BX SPEC US GUIDE*L*
1 series · 3 of 3 positions shown · non-contrast
Comparison: Previous exam(s).

ADDENDUM:
Pathology of the left breast revealed BENIGN MAMMARY DUCTS AND
LOBULES ASSOCIATED WITH MATURE ADIPOSE TISSUE. Comment: There is no
epithelial proliferative change, atypia, or malignancy in this
specimen. The imaging findings are noted, and the histologic
features support the clinical impression of mammary hamartoma. This
was found to be concordant by Dr. Tiger impression and notes.

At the patient's request, pathology and recommendations were relayed
to the patient by phone. She stated she did have some mild bleeding
at the biopsy site the day following the biopsy, but has no
bleeding, bruising, or palpable hematoma at the biopsy site today.
Post biopsy instructions were reviewed with the patient by phone.
She was encouraged to call the [REDACTED]
[REDACTED] or Inzunza, Jae Hwan ([REDACTED])
with any further questions or concerns.
Recommendations: Annual screening mammography (next due in 11
months).
Pathology and recommendations relayed by Inzunza, Jae Hwan on
09/24/14.
CLINICAL DATA: 2 cm mixed echogenicity mass in the 1 o'clock
position of the left breast at recent mammography and ultrasound.
EXAM:
ULTRASOUND GUIDED LEFT BREAST CORE NEEDLE BIOPSY

[Series 1: us breast bx w loc dev 1st lesion img bx spec us g · 0.08mm/px · 3 of 3 slices shown]
[im 1/3]
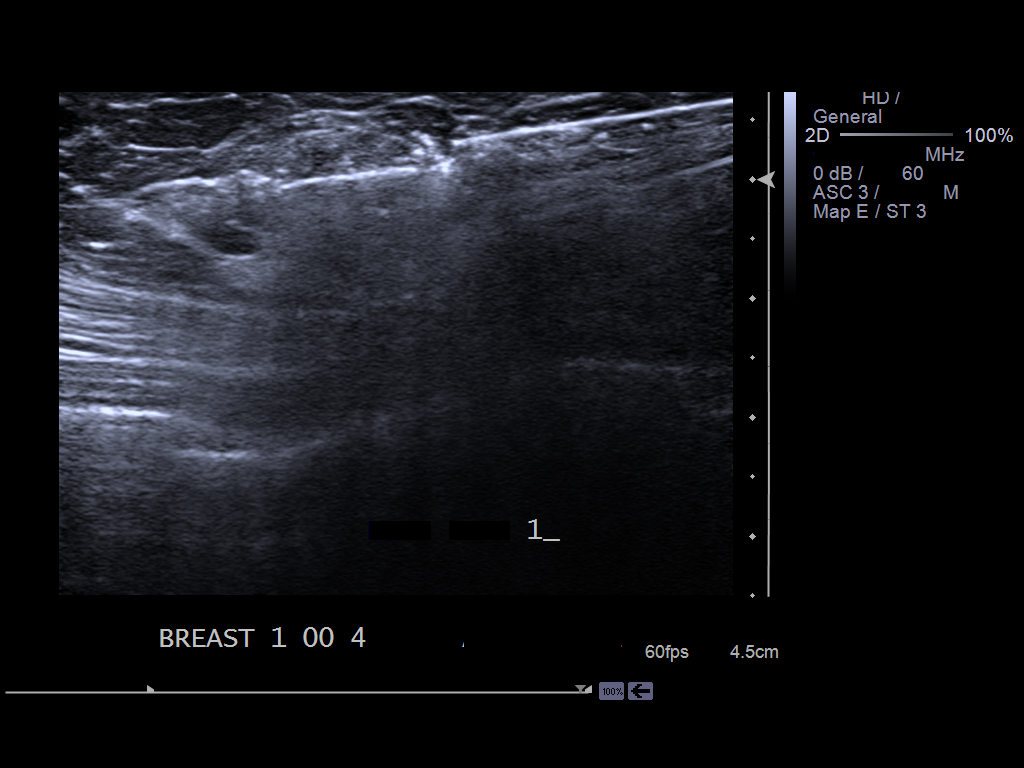
[im 2/3]
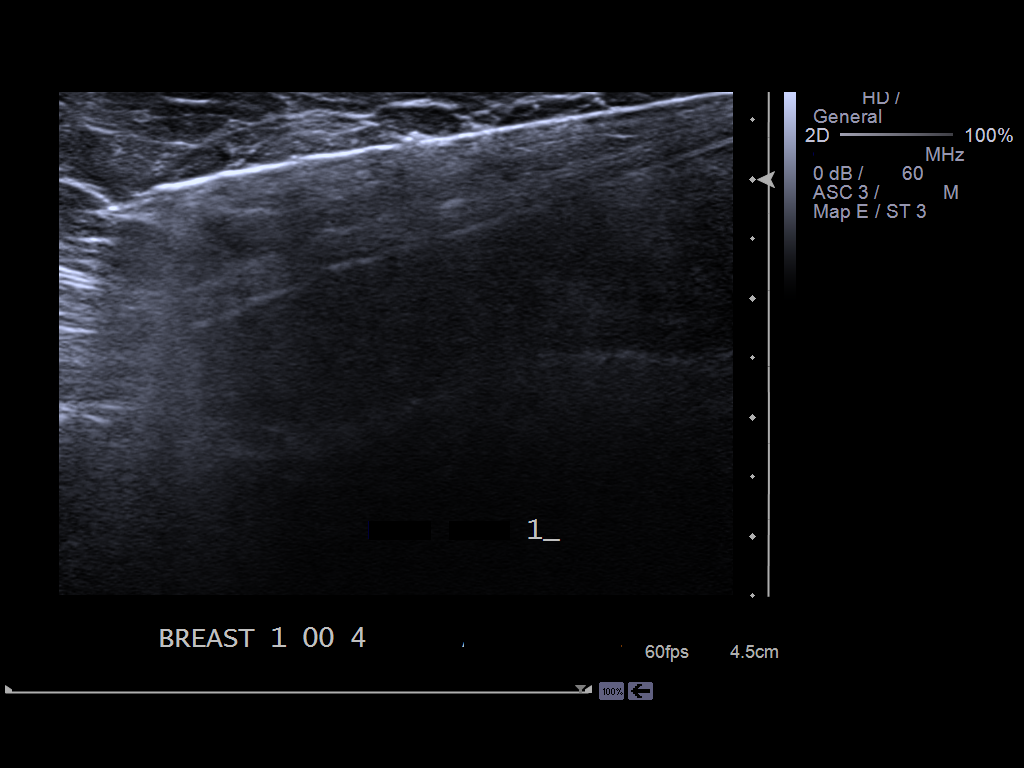
[im 3/3]
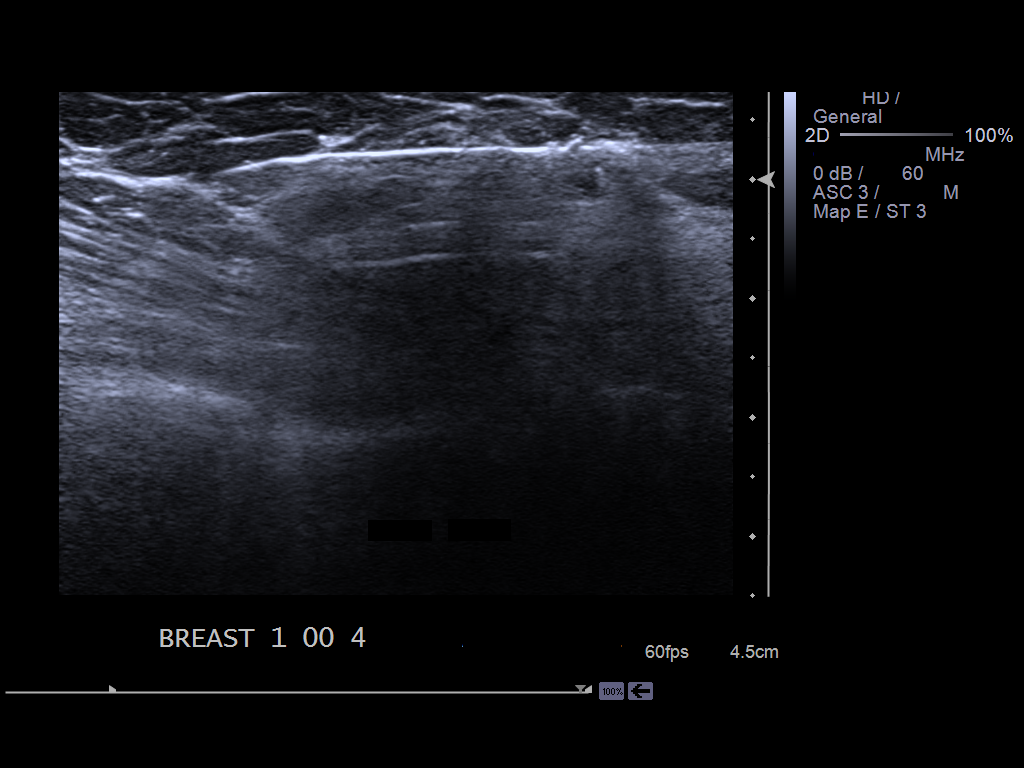

[3 of 3 positions shown; findings below may reference images not displayed]

PROCEDURE:
I met with the patient and we discussed the procedure of
ultrasound-guided biopsy, including benefits and alternatives. We
discussed the high likelihood of a successful procedure. We
discussed the risks of the procedure including infection, bleeding,
tissue injury, clip migration, and inadequate sampling. Informed
written consent was given. The usual time-out protocol was performed
immediately prior to the procedure.

Using sterile technique and 1% Lidocaine as local anesthetic, under
direct ultrasound visualization, a 12 gauge vacuum-assisted device
was used to perform biopsy of the recently demonstrated 2.0 cm mixed
echogenicity mass in the 1 o'clock position of the left breast, 4 cm
from the nipple,using a caudal approach. At the conclusion of the
procedure, a coil shaped tissue marker clip was deployed into the
biopsy cavity. Follow-up 2-view mammogram was performed and dictated
separately.
IMPRESSION: Ultrasound-guided biopsy of a 2.0 cm mixed echogenicity mass in the
1 o'clock position of the left breast. No apparent complications.

## 2018-05-04 ENCOUNTER — Encounter: Payer: Self-pay | Admitting: Emergency Medicine

## 2018-05-04 ENCOUNTER — Emergency Department: Payer: Self-pay

## 2018-05-04 ENCOUNTER — Other Ambulatory Visit: Payer: Self-pay

## 2018-05-04 ENCOUNTER — Emergency Department
Admission: EM | Admit: 2018-05-04 | Discharge: 2018-05-04 | Disposition: A | Discharge status: Home / Self Care | Payer: Self-pay | Attending: Emergency Medicine | Admitting: Emergency Medicine

## 2018-05-04 DIAGNOSIS — Z79899 Other long term (current) drug therapy: Secondary | ICD-10-CM | POA: Insufficient documentation

## 2018-05-04 DIAGNOSIS — I10 Essential (primary) hypertension: Secondary | ICD-10-CM | POA: Insufficient documentation

## 2018-05-04 DIAGNOSIS — N3000 Acute cystitis without hematuria: Secondary | ICD-10-CM

## 2018-05-04 DIAGNOSIS — N3090 Cystitis, unspecified without hematuria: Secondary | ICD-10-CM | POA: Insufficient documentation

## 2018-05-04 HISTORY — DX: Migraine, unspecified, not intractable, without status migrainosus: G43.909

## 2018-05-04 LAB — URINALYSIS, COMPLETE (UACMP) WITH MICROSCOPIC
BILIRUBIN URINE: NEGATIVE
GLUCOSE, UA: NEGATIVE mg/dL
HGB URINE DIPSTICK: NEGATIVE
Ketones, ur: NEGATIVE mg/dL
Nitrite: NEGATIVE
PH: 6 (ref 5.0–8.0)
Protein, ur: NEGATIVE mg/dL
SPECIFIC GRAVITY, URINE: 1.012 (ref 1.005–1.030)

## 2018-05-04 MED ORDER — SULFAMETHOXAZOLE-TRIMETHOPRIM 800-160 MG PO TABS: 1.0000 | ORAL_TABLET | Freq: Two times a day (BID) | ORAL | 0 refills | Status: DC

## 2018-05-04 MED ORDER — TRAMADOL HCL 50 MG PO TABS: 50.0000 mg | ORAL_TABLET | Freq: Two times a day (BID) | ORAL | 0 refills | Status: DC | PRN

## 2018-05-04 MED ORDER — PHENAZOPYRIDINE HCL 200 MG PO TABS: 200.0000 mg | ORAL_TABLET | Freq: Three times a day (TID) | ORAL | 0 refills | Status: DC | PRN

## 2018-05-04 NOTE — ED Provider Notes (Signed)
[facility] Emergency Department Provider Note   ____________________________________________   First MD Initiated Contact with Patient 05/04/18 0801     (approximate)  I have reviewed the triage vital signs and the nursing notes.   HISTORY  Chief Complaint Back Pain and Arm Pain    HPI Alexandria Waller is a 53 y.o. female patient presents with 2 days of increasing back pain.  Is non-provocative incident for complaint.  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  Patient also concerned secondary to tingling in left arm.  Patient was concerned secondary to sister having a recent MI.  Patient denies radicular component to her back pain.  Patient denies bladder bowel dysfunction.  States bilateral flank pain.  Patient rates her pain as a 10/10.  Patient's chronic pain is "achy".  No palliative measures for complaint.    Past Medical History:  Diagnosis Date  . Hypertension   . Migraines     There are no active problems to display for this patient.   History reviewed. No pertinent surgical history.  Prior to Admission medications   Medication Sig Start Date End Date Taking? Authorizing Provider  azithromycin (ZITHROMAX) 250 MG tablet Take 1 tablet (250 mg total) by mouth daily. 03/02/16   Minna Antis, MD  carvedilol (COREG) 25 MG tablet Take 25 mg by mouth 2 (two) times daily with a meal.    [provider]  cetirizine (ZYRTEC) 10 MG tablet Take 10 mg by mouth daily.    [provider]  fluticasone (FLONASE) 50 MCG/ACT nasal spray Place 2 sprays into both nostrils daily.    [provider]  naproxen (NAPROSYN) 500 MG tablet Take 1 tablet (500 mg total) by mouth 2 (two) times daily with a meal. 04/11/16   Joni Reining, PA-C  phenazopyridine (PYRIDIUM) 200 MG tablet Take 1 tablet (200 mg total) by mouth 3 (three) times daily as needed for pain. 05/04/18   Joni Reining, PA-C  potassium chloride (KLOR-CON) 20 MEQ packet  Take 20 mEq by mouth 2 (two) times daily. 07/17/14   Darien Ramus, MD  PRESCRIPTION MEDICATION Migraine PRN medication- unable to verify name of medication    [provider]  promethazine-dextromethorphan (PROMETHAZINE-DM) 6.25-15 MG/5ML syrup Take 5 mLs by mouth 4 (four) times daily as needed for cough. 03/11/15   Joni Reining, PA-C  sulfamethoxazole-trimethoprim (BACTRIM DS,SEPTRA DS) 800-160 MG tablet Take 1 tablet by mouth 2 (two) times daily. 03/11/15   Joni Reining, PA-C  sulfamethoxazole-trimethoprim (BACTRIM DS,SEPTRA DS) 800-160 MG tablet Take 1 tablet by mouth 2 (two) times daily. 05/04/18   Joni Reining, PA-C  traMADol (ULTRAM) 50 MG tablet Take 1 tablet (50 mg total) by mouth every 12 (twelve) hours as needed. 05/04/18   Joni Reining, PA-C    Allergies Amoxicillin; Aspirin; and Penicillins  Family History  Problem Relation Age of Onset  . Breast cancer Sister 69    Social History Social History   Tobacco Use  . Smoking status: Former Games developer  . Smokeless tobacco: Never Used  Substance Use Topics  . Alcohol use: No  . Drug use: No    Review of Systems  Constitutional: No fever/chills Eyes: No visual changes. ENT: No sore throat. Cardiovascular: Denies chest pain. Respiratory: Denies shortness of breath. Gastrointestinal: No abdominal pain.  No nausea, no vomiting.  No diarrhea.  No constipation. Genitourinary: Negative for dysuria. Musculoskeletal: Negative for back pain. Skin: Negative for rash. Neurological: Negative  for headaches, focal weakness or numbness. Endocrine:  Hypertension Allergic/Immunilogical: Penicillin _____________   PHYSICAL EXAM:  VITAL SIGNS: ED Triage Vitals  Enc Vitals Group     BP 05/04/18 0706 (!) 162/108     Pulse Rate 05/04/18 0706 81     Resp 05/04/18 0706 16     Temp 05/04/18 0706 98.1 F (36.7 C)     Temp Source 05/04/18 0706 Oral     SpO2 05/04/18 0706 96 %     Weight 05/04/18 0706 135 lb (61.2 kg)      Height 05/04/18 0706 5\' 3"  (1.6 m)     Head Circumference --      Peak Flow --      Pain Score 05/04/18 0718 10     Pain Loc --      Pain Edu? --      Excl. in GC? --     Constitutional: Alert and oriented. Well appearing and in no acute distress. Hematological/Lymphatic/Immunilogical: No cervical lymphadenopathy. Cardiovascular: Normal rate, regular rhythm. Grossly normal heart sounds.  Good peripheral circulation. Respiratory: Normal respiratory effort.  No retractions. Lungs CTAB. Gastrointestinal: Soft and nontender. No distention. No abdominal bruits. No CVA tenderness. Musculoskeletal: No obvious deformity to the lumbar spine.  Neurologic:  Normal speech and language. No gross focal neurologic deficits are appreciated. No gait instability. Skin:  Skin is warm, dry and intact. No rash noted. Psychiatric: Mood and affect are normal. Speech and behavior are normal.  ____________________________________________   LABS (all labs ordered are listed, but only abnormal results are displayed)  Labs Reviewed  URINALYSIS, COMPLETE (UACMP) WITH MICROSCOPIC - Abnormal; Notable for the following components:      Result Value   Color, Urine YELLOW (*)    APPearance HAZY (*)    Leukocytes,Ua LARGE (*)    WBC, UA >50 (*)    Bacteria, UA MANY (*)    All other components within normal limits  URINE CULTURE   ____________________________________________  EKG  EKG read by heart station Dr. with no acute findings. ____________________________________________  RADIOLOGY  ED MD interpretation:    Official radiology report(s): Dg Lumbar Spine 2-3 Views  Result Date: 05/04/2018 CLINICAL DATA:  Low back pain 3 days EXAM: LUMBAR SPINE - 2-3 VIEW COMPARISON:  None. FINDINGS: There is no evidence of lumbar spine fracture. Alignment is normal. Intervertebral disc spaces are maintained. IMPRESSION: Negative. Electronically Signed   By: Marlan Palauharles  Clark M.D.   On: 05/04/2018 08:56     ____________________________________________   PROCEDURES  Procedure(s) performed: None  Procedures  Critical Care performed: No  ____________________________________________   INITIAL IMPRESSION / ASSESSMENT AND PLAN / ED COURSE  As part of my medical decision making, I reviewed the following data within the electronic MEDICAL RECORD NUMBER     Increasing back pain for the last 2 days.  Discussed neck x-ray findings with patient.  Patient urinalysis was consistent with urinary tract infection.  Culture has been ordered.  Patient given discharge care instruction advised take medication as directed.      ____________________________________________   FINAL CLINICAL IMPRESSION(S) / ED DIAGNOSES  Final diagnoses:  Acute cystitis without hematuria     ED Discharge Orders         Ordered    sulfamethoxazole-trimethoprim (BACTRIM DS,SEPTRA DS) 800-160 MG tablet  2 times daily     05/04/18 0925    phenazopyridine (PYRIDIUM) 200 MG tablet  3 times daily PRN     05/04/18 0925    traMADol (  ULTRAM) 50 MG tablet  Every 12 hours PRN     05/04/18 0925           Note:  This document was prepared using Dragon voice recognition software and may include unintentional dictation errors.    Joni Reining, PA-C 05/04/18 1610    Emily Filbert, MD 05/04/18 1113

## 2018-05-04 NOTE — ED Triage Notes (Addendum)
Patient states she has lower back pain since 2/17 afternoon.  Works at the [employer].  States pain is worse with bending over, had to have someone put her socks and shoes this AM.  Denies dysuria or problems with bowels.  Denies numbness or tingling in legs.  Complaining of tingling in left arm starting at 0530 this AM.  Sister had a recent MI.  Worried because her left arm hurts. EKG performed and reviewed by Dr. Manson Passey.

## 2018-05-06 LAB — URINE CULTURE
Culture: >=100000 — AB
Special Requests: NORMAL

## 2018-07-20 ENCOUNTER — Ambulatory Visit: Payer: Self-pay

## 2020-03-01 IMAGING — CR DG LUMBAR SPINE 2-3V
1 series · 3 of 3 positions shown · non-contrast
Comparison: None.

CLINICAL DATA: Low back pain 3 days

EXAM:
LUMBAR SPINE - 2-3 VIEW

[Series 1: dg lumbar spine 2-3 views · 0.14mm/px · 3 of 3 slices shown]
[im 1/3]
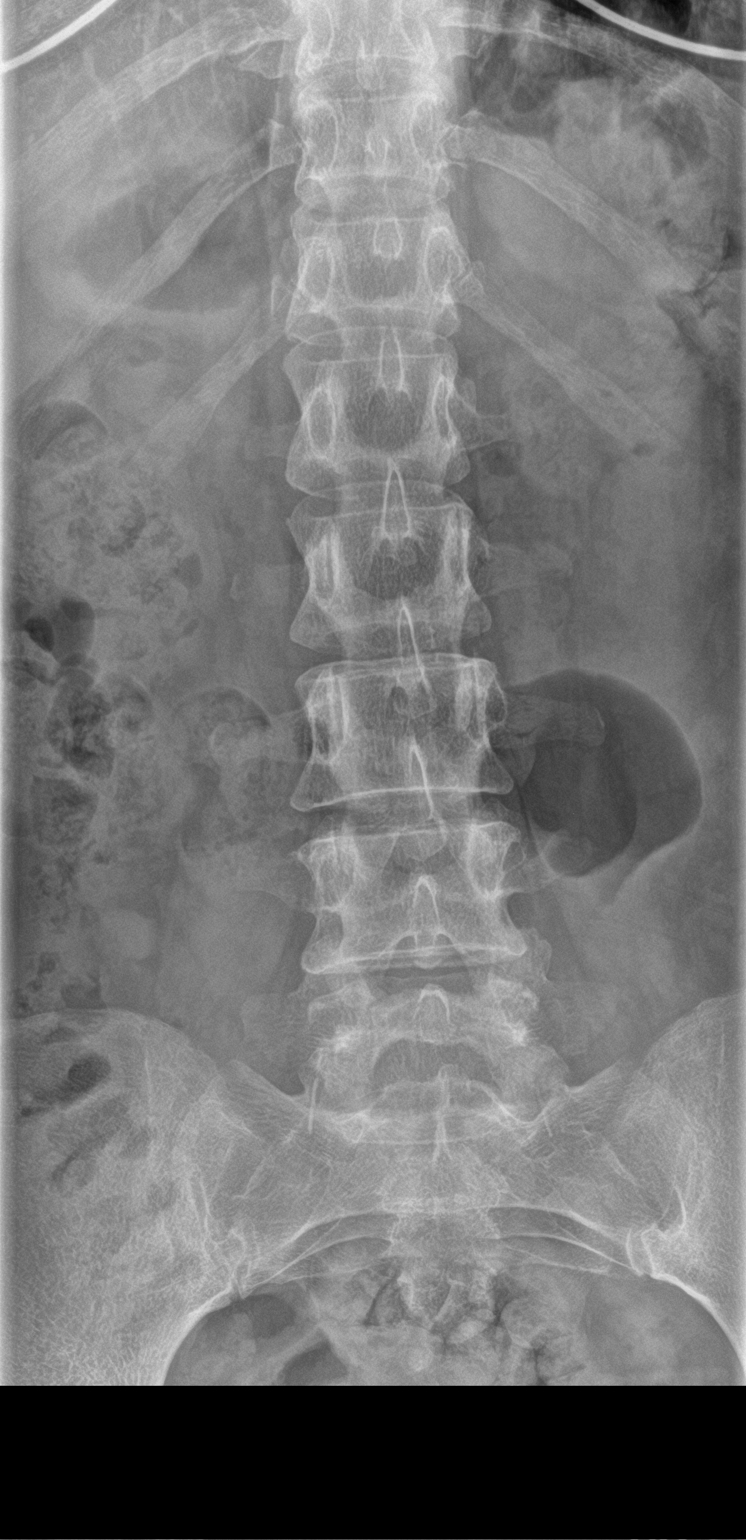
[im 2/3]
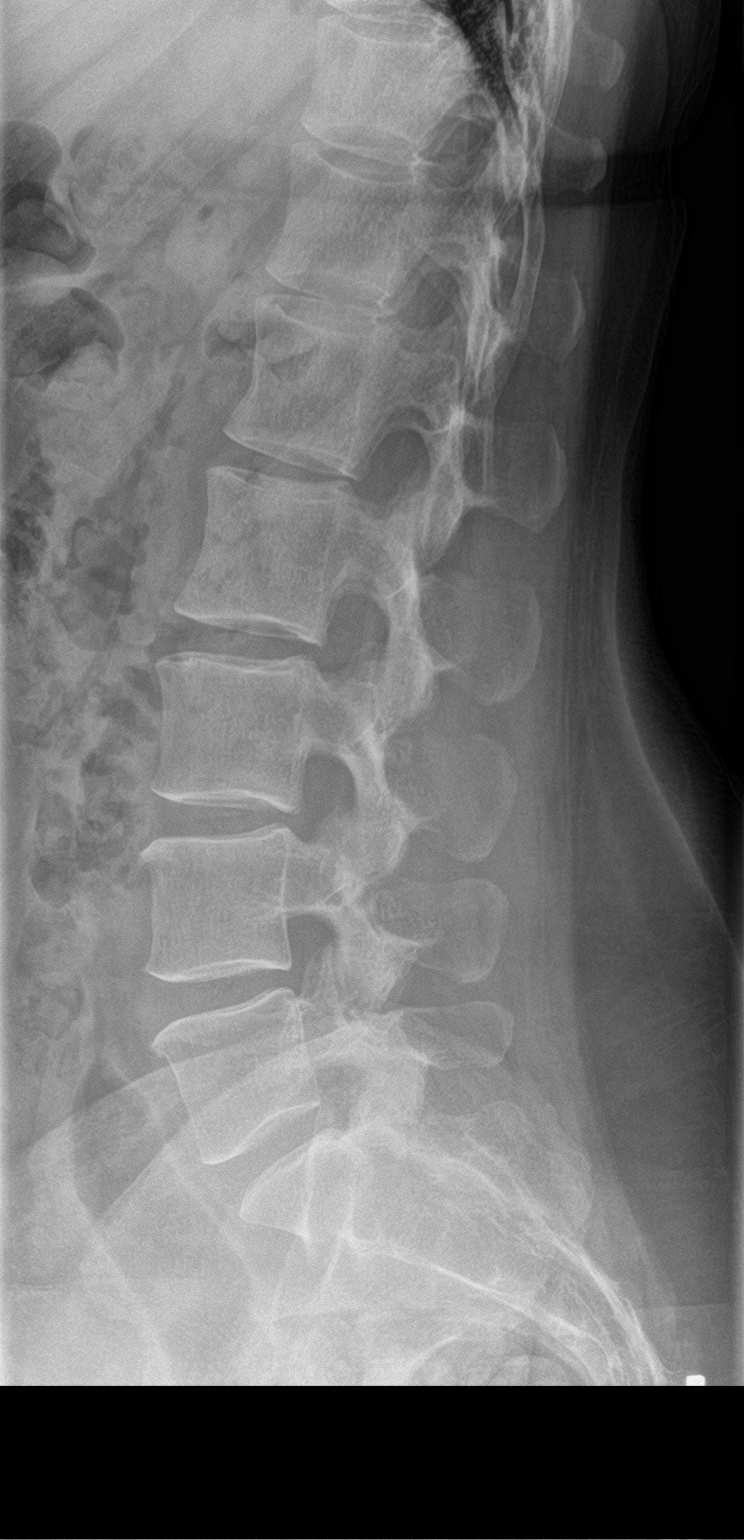
[im 3/3]
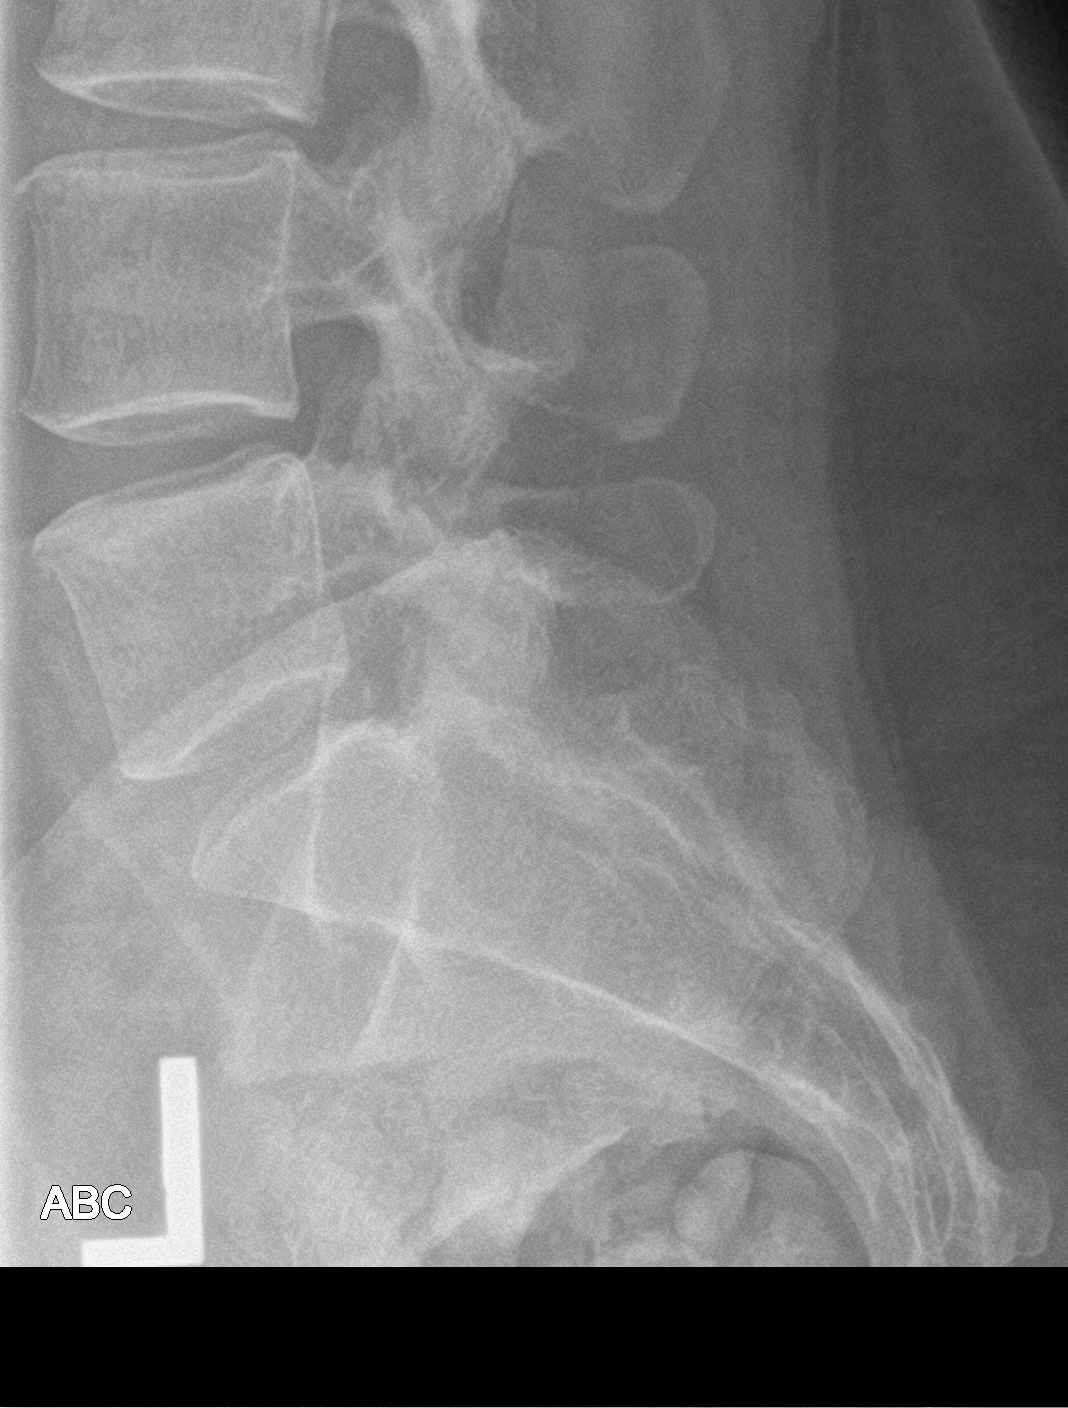

[3 of 3 positions shown; findings below may reference images not displayed]

FINDINGS: There is no evidence of lumbar spine fracture. Alignment is normal.
Intervertebral disc spaces are maintained.
IMPRESSION: Negative.

## 2021-03-14 ENCOUNTER — Emergency Department
Admission: EM | Admit: 2021-03-14 | Discharge: 2021-03-14 | Disposition: A | Discharge status: Home / Self Care | Payer: Self-pay | Attending: Emergency Medicine | Admitting: Emergency Medicine

## 2021-03-14 ENCOUNTER — Other Ambulatory Visit: Payer: Self-pay

## 2021-03-14 DIAGNOSIS — K0381 Cracked tooth: Secondary | ICD-10-CM | POA: Insufficient documentation

## 2021-03-14 DIAGNOSIS — Z5321 Procedure and treatment not carried out due to patient leaving prior to being seen by health care provider: Secondary | ICD-10-CM | POA: Insufficient documentation

## 2021-03-14 DIAGNOSIS — K0889 Other specified disorders of teeth and supporting structures: Secondary | ICD-10-CM | POA: Insufficient documentation

## 2021-03-14 NOTE — ED Notes (Signed)
Call to be roomed

## 2021-03-14 NOTE — ED Notes (Signed)
Called to be roomed

## 2021-03-14 NOTE — ED Notes (Signed)
Called third time for room with no answer. Called home to check on patient, but number states it is the wrong phone number.

## 2021-03-14 NOTE — ED Triage Notes (Signed)
Pt has had left upper dental pain for a few months. Tooth has broken.  Has an appointment for 1/10 to get tooth extracted. Pain has increased over past few days. Tylenol and ibuprofen are not helping like usual.

## 2021-11-29 ENCOUNTER — Emergency Department
Admission: EM | Admit: 2021-11-29 | Discharge: 2021-11-29 | Disposition: A | Discharge status: Home / Self Care | Payer: Self-pay | Attending: Emergency Medicine | Admitting: Emergency Medicine

## 2021-11-29 ENCOUNTER — Other Ambulatory Visit: Payer: Self-pay

## 2021-11-29 ENCOUNTER — Encounter: Payer: Self-pay | Admitting: Emergency Medicine

## 2021-11-29 DIAGNOSIS — I10 Essential (primary) hypertension: Secondary | ICD-10-CM | POA: Insufficient documentation

## 2021-11-29 DIAGNOSIS — U071 COVID-19: Secondary | ICD-10-CM | POA: Insufficient documentation

## 2021-11-29 LAB — SARS CORONAVIRUS 2 BY RT PCR: SARS Coronavirus 2 by RT PCR: POSITIVE — AB

## 2021-11-29 NOTE — Discharge Instructions (Signed)
Today you are positive for COVID.  You should quarantine for 9 more days as you are very contagious.  Also let people that you have been around also know that you tested positive.  Continue with your cough medication over-the-counter and Tylenol or ibuprofen as needed for headache, body aches or fever.  Drink lots of fluids to stay hydrated.  Return to the emergency department immediately should you develop any shortness of breath or difficulty breathing.

## 2021-11-29 NOTE — ED Triage Notes (Signed)
Pt reports headache and nasal congestion and drainage since yesterday. Pt reports took a covid test but it was negative. Pt reports has a lot of allergy and sinus issues so is not sure if it is a sinus infection or covid

## 2021-11-29 NOTE — ED Provider Notes (Signed)
[facility] Provider Note    Event Date/Time   First MD Initiated Contact with Patient 11/29/21 0830     (approximate)   History   Headache and Nasal Congestion   HPI  Alexandria Waller is a 56 y.o. female to the ED with complaint of headache, nasal congestion, drainage and cough.  Denies sore throat.  Patient states she took a COVID test yesterday when her symptoms started and it was negative.  Patient did not get the COVID-vaccine but did get the flu vaccine.  Patient has a history of hypertension and migraines.  She denies any nausea, vomiting or diarrhea.     Physical Exam   Triage Vital Signs: ED Triage Vitals [11/29/21 0826]  Enc Vitals Group     BP      Pulse      Resp      Temp      Temp src      SpO2      Weight 137 lb (62.1 kg)     Height 5\' 2"  (1.575 m)     Head Circumference      Peak Flow      Pain Score 8     Pain Loc      Pain Edu?      Excl. in GC?     Most recent vital signs: Vitals:   11/29/21 0832 11/29/21 1025  BP: (!) 166/99 124/89  Pulse: (!) 110 88  Resp: 16 16  Temp: 98.6 F (37 C) 98.1 F (36.7 C)  SpO2: 96% 93%     General: Awake, no distress.  CV:  Good peripheral perfusion.  Regular rate and rhythm. Resp:  Normal effort.  Clear bilaterally. Abd:  No distention.  Other:  Nasal congestion.  Congested cough noted during exam.   ED Results / Procedures / Treatments   Labs (all labs ordered are listed, but only abnormal results are displayed) Labs Reviewed  SARS CORONAVIRUS 2 BY RT PCR - Abnormal; Notable for the following components:      Result Value   SARS Coronavirus 2 by RT PCR POSITIVE (*)    All other components within normal limits      PROCEDURES:  Critical Care performed:   Procedures   MEDICATIONS ORDERED IN ED: Medications - No data to display   IMPRESSION / MDM / ASSESSMENT AND PLAN / ED COURSE  I reviewed the triage vital signs and the nursing notes.   Differential  diagnosis includes, but is not limited to, COVID, URI with cough.  56 year old female presents to the ED with complaint of upper respiratory symptoms that began yesterday with a negative home COVID test.  Patient was made aware that the test done in the emergency department is positive and that she should plan on quarantine for another 9 days.  She has medication at home that she has been using for cough and congestion.  She is encouraged to drink fluids frequently to stay hydrated and also take Tylenol or ibuprofen as needed.  She is to follow-up with her PCP but return to the emergency department should she develop any shortness of breath or difficulty breathing.      Patient's presentation is most consistent with acute complicated illness / injury requiring diagnostic workup.  FINAL CLINICAL IMPRESSION(S) / ED DIAGNOSES   Final diagnoses:  COVID     Rx / DC Orders   ED Discharge Orders     None  Note:  This document was prepared using Dragon voice recognition software and may include unintentional dictation errors.   Johnn Hai, PA-C 11/29/21 1031    Nena Polio, MD 11/29/21 [phone]

## 2023-05-28 ENCOUNTER — Emergency Department
Admission: EM | Admit: 2023-05-28 | Discharge: 2023-05-28 | Disposition: A | Discharge status: Home / Self Care | Payer: Self-pay | Attending: Emergency Medicine | Admitting: Emergency Medicine

## 2023-05-28 ENCOUNTER — Other Ambulatory Visit: Payer: Self-pay

## 2023-05-28 DIAGNOSIS — J069 Acute upper respiratory infection, unspecified: Secondary | ICD-10-CM | POA: Insufficient documentation

## 2023-05-28 DIAGNOSIS — I10 Essential (primary) hypertension: Secondary | ICD-10-CM | POA: Insufficient documentation

## 2023-05-28 LAB — RESP PANEL BY RT-PCR (RSV, FLU A&B, COVID)  RVPGX2
Influenza A by PCR: NEGATIVE
Influenza B by PCR: NEGATIVE
Resp Syncytial Virus by PCR: NEGATIVE
SARS Coronavirus 2 by RT PCR: NEGATIVE

## 2023-05-28 LAB — GROUP A STREP BY PCR: Group A Strep by PCR: NOT DETECTED

## 2023-05-28 MED ORDER — PROMETHAZINE-DM 6.25-15 MG/5ML PO SYRP: 5.0000 mL | ORAL_SOLUTION | Freq: Four times a day (QID) | ORAL | 0 refills | Status: AC | PRN

## 2023-05-28 NOTE — Discharge Instructions (Signed)
 Keep your appointment with your primary care provider as scheduled.  Increase fluids.  Tylenol or ibuprofen as needed for body aches, headache or fever.  A prescription for promethazine DM was sent to the pharmacy for you to take as needed for cough, congestion and runny nose.

## 2023-05-28 NOTE — ED Notes (Signed)
 Patient stated that she has taken her blood pressure medication this morning.

## 2023-05-28 NOTE — ED Triage Notes (Signed)
 Pt to ED via POV from home. Pt reports sore throat, runny nose and eye drainage since Monday. Pt also reports bilateral leg pain with hx of restless leg syndrome.

## 2023-05-28 NOTE — ED Provider Notes (Signed)
 [facility] Provider Note    Event Date/Time   First MD Initiated Contact with Patient 05/28/23 [phone]     (approximate)   History   Sore Throat   HPI  Alexandria Waller is a 58 y.o. female presents to the ED with complaint of sore throat, rhinorrhea, congestion and drainage from her eye.  Patient also has had occasional cough.  This been going on for approximately 5 days.  She is unaware of any fever, chills and denies nausea, vomiting or diarrhea.  Patient reports that she has taken multiple over-the-counter medications without any relief.  She has a history of migraines and hypertension and reports that she did take her medication for hypertension this morning.     Physical Exam   Triage Vital Signs: ED Triage Vitals  Encounter Vitals Group     BP 05/28/23 0713 (!) 158/116     Systolic BP Percentile --      Diastolic BP Percentile --      Pulse Rate 05/28/23 0713 92     Resp 05/28/23 0713 20     Temp 05/28/23 0715 98.3 F (36.8 C)     Temp Source 05/28/23 0715 Oral     SpO2 05/28/23 0713 98 %     Weight --      Height --      Head Circumference --      Peak Flow --      Pain Score 05/28/23 0713 5     Pain Loc --      Pain Education --      Exclude from Growth Chart --     Most recent vital signs: Vitals:   05/28/23 0715 05/28/23 0823  BP:  (!) 134/116  Pulse:    Resp:    Temp: 98.3 F (36.8 C)   SpO2:       General: Awake, no distress.  CV:  Good peripheral perfusion.  Heart rate and rate rhythm. Resp:  Normal effort.  Lungs clear bilaterally. Abd:  No distention.  Other:  Posterior pharynx without erythema or exudate.  Neck supple without cervical lymphadenopathy.   ED Results / Procedures / Treatments   Labs (all labs ordered are listed, but only abnormal results are displayed) Labs Reviewed  GROUP A STREP BY PCR  RESP PANEL BY RT-PCR (RSV, FLU A&B, COVID)  [id number]      PROCEDURES:  Critical Care performed:    Procedures   MEDICATIONS ORDERED IN ED: Medications - No data to display   IMPRESSION / MDM / ASSESSMENT AND PLAN / ED COURSE  I reviewed the triage vital signs and the nursing notes.   Differential diagnosis includes, but is not limited to, COVID, influenza, RSV, strep pharyngitis, upper respiratory infection, viral illness.  59 year old female presents to the ED with complaint of upper respiratory symptoms along with sore throat.  Patient had a nonproductive cough and has taken over-the-counter medications.  She reports that she did take her antihypertensive medication this morning although her blood pressure is elevated.  Patient was reassured with respiratory panel and strep negative.  Patient was told to continue with clear liquids, Tylenol, over-the-counter medications for symptoms and follow-up with her PCP if any continued problems.  Also she was encouraged to follow-up with her PCP for recheck of her blood pressure.      Patient's presentation is most consistent with acute illness / injury with system symptoms.  FINAL CLINICAL IMPRESSION(S) / ED DIAGNOSES   Final  diagnoses:  Viral URI     Rx / DC Orders   ED Discharge Orders          Ordered    promethazine-dextromethorphan (PROMETHAZINE-DM) 6.25-15 MG/5ML syrup  4 times daily PRN        05/28/23 0842             Note:  This document was prepared using Dragon voice recognition software and may include unintentional dictation errors.   Tommi Rumps, PA-C 05/28/23 1339    Sharman Cheek, MD 05/28/23 [phone]

## 2023-05-28 NOTE — ED Notes (Signed)
 58 yof sitting upright in the bed with her head elevated. The pt is warm, pink, and dry. The pt is alert and oriented x 4. The pt advised she is here because of eye drainage and sore throat since Monday. The pt also has a c/o bilateral leg discomfort. The pt's call bell left at bedside.

## 2024-01-14 ENCOUNTER — Encounter: Payer: Self-pay | Admitting: Emergency Medicine

## 2024-01-14 ENCOUNTER — Emergency Department
Admission: EM | Admit: 2024-01-14 | Discharge: 2024-01-14 | Disposition: A | Discharge status: Home / Self Care | Payer: Self-pay | Attending: Emergency Medicine | Admitting: Emergency Medicine

## 2024-01-14 ENCOUNTER — Other Ambulatory Visit: Payer: Self-pay

## 2024-01-14 DIAGNOSIS — K047 Periapical abscess without sinus: Secondary | ICD-10-CM | POA: Insufficient documentation

## 2024-01-14 DIAGNOSIS — I10 Essential (primary) hypertension: Secondary | ICD-10-CM | POA: Insufficient documentation

## 2024-01-14 MED ORDER — CLINDAMYCIN HCL 300 MG PO CAPS: 300.0000 mg | ORAL_CAPSULE | Freq: Three times a day (TID) | ORAL | 0 refills | Status: AC

## 2024-01-14 NOTE — ED Notes (Signed)
 Shona Gin, PA-C aware of blood pressure.

## 2024-01-14 NOTE — ED Notes (Signed)
 See triage note  Presents with some swelling to left side of face  States she does have issues with sinusitis and also has a broken tooth  Afebrile on arrival

## 2024-01-14 NOTE — ED Triage Notes (Signed)
 Patient to ED via POV for facial swelling on left side of face. States it started last night. States she has a broken tooth on that side that she has not gotten fixed yet.

## 2024-01-14 NOTE — ED Provider Notes (Signed)
 [facility] Provider Note    Event Date/Time   First MD Initiated Contact with Patient 01/14/24 [phone]     (approximate)   History   Facial Swelling   HPI  Alexandria Waller is a 58 y.o. female   presents to the ED with complaint of left-sided facial pain that was noticed last night.  Patient states that she broke her tooth off approximately 1 month ago and has not been seen by her dentist since that time.  Patient has a history of hypertension and migraines.  She reports that she does have a education officer, community at [facility].  Patient has not taken her blood pressure medication today.      Physical Exam   Triage Vital Signs: ED Triage Vitals  Encounter Vitals Group     BP 01/14/24 0850 (!) 177/109     Girls Systolic BP Percentile --      Girls Diastolic BP Percentile --      Boys Systolic BP Percentile --      Boys Diastolic BP Percentile --      Pulse Rate 01/14/24 0850 90     Resp 01/14/24 0850 17     Temp 01/14/24 0850 98.2 F (36.8 C)     Temp Source 01/14/24 0850 Oral     SpO2 01/14/24 0850 98 %     Weight 01/14/24 0851 138 lb (62.6 kg)     Height 01/14/24 0851 5' 3 (1.6 m)     Head Circumference --      Peak Flow --      Pain Score 01/14/24 0851 4     Pain Loc --      Pain Education --      Exclude from Growth Chart --     Most recent vital signs: Vitals:   01/14/24 0850 01/14/24 0953  BP: (!) 177/109 (!) 159/117  Pulse: 90   Resp: 17   Temp: 98.2 F (36.8 C)   SpO2: 98%      General: Awake, no distress.  CV:  Good peripheral perfusion.  Resp:  Normal effort.  Abd:  No distention.  Other:  Left upper central incisor is broken off with gum edema and tenderness.  No obvious drainage present.  Neck is supple without cervical lymphadenopathy.   ED Results / Procedures / Treatments   Labs (all labs ordered are listed, but only abnormal results are displayed) Labs Reviewed - No data to display    PROCEDURES:  Critical Care  performed:   Procedures   MEDICATIONS ORDERED IN ED: Medications - No data to display   IMPRESSION / MDM / ASSESSMENT AND PLAN / ED COURSE  I reviewed the triage vital signs and the nursing notes.   Differential diagnosis includes, but is not limited to, left dental pain, abscess, gingivitis, sinusitis.  58 year old female presents to the ED with complaint of facial edema and known dental fracture.  Patient broke her tooth a month ago and has not seen her dentist.  Most likely her facial edema is secondary to infection in this area.  A prescription for clindamycin was sent to the pharmacy.  Patient states that she has a education officer, community at [facility] and will make a phone call to see them about this tooth.  She was to take over-the-counter medication and patient requested a work note.      Patient's presentation is most consistent with acute, uncomplicated illness.  FINAL CLINICAL IMPRESSION(S) / ED DIAGNOSES   Final  diagnoses:  Dental infection     Rx / DC Orders   ED Discharge Orders          Ordered    clindamycin (CLEOCIN) 300 MG capsule  3 times daily        01/14/24 0950             Note:  This document was prepared using Dragon voice recognition software and may include unintentional dictation errors.   Saunders Shona CROME, PA-C 01/14/24 1019    Dorothyann Drivers, MD 01/14/24 [phone]

## 2024-01-14 NOTE — Discharge Instructions (Signed)
 Call the dental office at [facility] to make an appointment to be seen for your tooth.  A prescription for clindamycin was sent to the pharmacy for you to begin taking 3 times a day for the next 7 days.  Tylenol or ibuprofen as needed for pain.
